# Patient Record
Sex: Female | Born: 1990 | Race: White | Hispanic: No | Marital: Married | Smoking: Former smoker
Health system: Southern US, Community
[De-identification: ages and names within clinical notes are randomized; demographics above are authoritative.]

## PROBLEM LIST (undated history)

## (undated) DIAGNOSIS — A749 Chlamydial infection, unspecified: Secondary | ICD-10-CM

## (undated) DIAGNOSIS — Z87891 Personal history of nicotine dependence: Secondary | ICD-10-CM

## (undated) DIAGNOSIS — J45909 Unspecified asthma, uncomplicated: Secondary | ICD-10-CM

## (undated) DIAGNOSIS — A6000 Herpesviral infection of urogenital system, unspecified: Secondary | ICD-10-CM

## (undated) DIAGNOSIS — F32A Depression, unspecified: Secondary | ICD-10-CM

## (undated) DIAGNOSIS — E282 Polycystic ovarian syndrome: Secondary | ICD-10-CM

## (undated) DIAGNOSIS — F1290 Cannabis use, unspecified, uncomplicated: Secondary | ICD-10-CM

## (undated) DIAGNOSIS — K219 Gastro-esophageal reflux disease without esophagitis: Secondary | ICD-10-CM

## (undated) DIAGNOSIS — Z7289 Other problems related to lifestyle: Secondary | ICD-10-CM

## (undated) DIAGNOSIS — D649 Anemia, unspecified: Secondary | ICD-10-CM

## (undated) DIAGNOSIS — O24419 Gestational diabetes mellitus in pregnancy, unspecified control: Secondary | ICD-10-CM

## (undated) DIAGNOSIS — N739 Female pelvic inflammatory disease, unspecified: Secondary | ICD-10-CM

## (undated) HISTORY — DX: Herpesviral infection of urogenital system, unspecified: A60.00

## (undated) HISTORY — DX: Polycystic ovarian syndrome: E28.2

## (undated) HISTORY — DX: Depression, unspecified: F32.A

## (undated) HISTORY — DX: Chlamydial infection, unspecified: A74.9

## (undated) HISTORY — DX: Female pelvic inflammatory disease, unspecified: N73.9

## (undated) HISTORY — PX: HAND SURGERY: SHX662

---
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
## 2011-08-17 ENCOUNTER — Ambulatory Visit: Payer: Self-pay | Admitting: Family Medicine

## 2011-08-17 LAB — RAPID STREP-A WITH REFLX: Micro Text Report: POSITIVE

## 2011-12-16 ENCOUNTER — Ambulatory Visit: Payer: Self-pay | Admitting: Medical

## 2012-05-09 ENCOUNTER — Ambulatory Visit: Payer: Self-pay | Admitting: Family Medicine

## 2012-05-09 LAB — URINALYSIS, COMPLETE
Glucose,UR: NEGATIVE mg/dL (ref 0–75)
Ketone: NEGATIVE
Ph: 6.5 (ref 4.5–8.0)
Protein: 30

## 2012-05-19 DIAGNOSIS — A749 Chlamydial infection, unspecified: Secondary | ICD-10-CM

## 2012-05-19 HISTORY — DX: Chlamydial infection, unspecified: A74.9

## 2012-05-24 ENCOUNTER — Ambulatory Visit: Payer: Self-pay

## 2012-05-24 LAB — URINALYSIS, COMPLETE
Blood: NEGATIVE
Ph: 6.5 (ref 4.5–8.0)
Protein: NEGATIVE

## 2012-05-24 LAB — WET PREP, GENITAL

## 2012-05-25 LAB — URINE CULTURE

## 2012-09-16 DIAGNOSIS — N739 Female pelvic inflammatory disease, unspecified: Secondary | ICD-10-CM

## 2012-09-16 HISTORY — DX: Female pelvic inflammatory disease, unspecified: N73.9

## 2015-06-26 LAB — HM PAP SMEAR: HM PAP: NEGATIVE

## 2016-02-04 ENCOUNTER — Emergency Department: Payer: Self-pay

## 2016-02-04 ENCOUNTER — Emergency Department
Admission: EM | Admit: 2016-02-04 | Discharge: 2016-02-04 | Disposition: A | Discharge status: Home / Self Care | Payer: Self-pay | Attending: Emergency Medicine | Admitting: Emergency Medicine

## 2016-02-04 ENCOUNTER — Encounter: Payer: Self-pay | Admitting: Emergency Medicine

## 2016-02-04 DIAGNOSIS — R1013 Epigastric pain: Secondary | ICD-10-CM

## 2016-02-04 DIAGNOSIS — R112 Nausea with vomiting, unspecified: Secondary | ICD-10-CM | POA: Insufficient documentation

## 2016-02-04 DIAGNOSIS — K529 Noninfective gastroenteritis and colitis, unspecified: Secondary | ICD-10-CM | POA: Insufficient documentation

## 2016-02-04 HISTORY — DX: Unspecified asthma, uncomplicated: J45.909

## 2016-02-04 LAB — URINALYSIS COMPLETE WITH MICROSCOPIC (ARMC ONLY)
BILIRUBIN URINE: NEGATIVE
Bacteria, UA: NONE SEEN
GLUCOSE, UA: NEGATIVE mg/dL
Hgb urine dipstick: NEGATIVE
Leukocytes, UA: NEGATIVE
Nitrite: NEGATIVE
PH: 8 (ref 5.0–8.0)
Protein, ur: NEGATIVE mg/dL
RBC / HPF: NONE SEEN RBC/hpf (ref 0–5)
Specific Gravity, Urine: 1.015 (ref 1.005–1.030)

## 2016-02-04 LAB — CBC
HCT: 43.5 % (ref 35.0–47.0)
Hemoglobin: 14.8 g/dL (ref 12.0–16.0)
MCH: 30.8 pg (ref 26.0–34.0)
MCHC: 34.1 g/dL (ref 32.0–36.0)
MCV: 90.2 fL (ref 80.0–100.0)
PLATELETS: 235 10*3/uL (ref 150–440)
RBC: 4.82 MIL/uL (ref 3.80–5.20)
RDW: 12.9 % (ref 11.5–14.5)
WBC: 14.9 10*3/uL — ABNORMAL HIGH (ref 3.6–11.0)

## 2016-02-04 LAB — COMPREHENSIVE METABOLIC PANEL
ALK PHOS: 54 U/L (ref 38–126)
ALT: 60 U/L — AB (ref 14–54)
AST: 60 U/L — ABNORMAL HIGH (ref 15–41)
Albumin: 4.4 g/dL (ref 3.5–5.0)
Anion gap: 12 (ref 5–15)
BUN: 9 mg/dL (ref 6–20)
CALCIUM: 9.5 mg/dL (ref 8.9–10.3)
CO2: 20 mmol/L — AB (ref 22–32)
CREATININE: 0.63 mg/dL (ref 0.44–1.00)
Chloride: 108 mmol/L (ref 101–111)
Glucose, Bld: 163 mg/dL — ABNORMAL HIGH (ref 65–99)
Potassium: 3.5 mmol/L (ref 3.5–5.1)
SODIUM: 140 mmol/L (ref 135–145)
Total Bilirubin: 1.2 mg/dL (ref 0.3–1.2)
Total Protein: 7.6 g/dL (ref 6.5–8.1)

## 2016-02-04 LAB — LIPASE, BLOOD: Lipase: 17 U/L (ref 11–51)

## 2016-02-04 LAB — PREGNANCY, URINE: Preg Test, Ur: NEGATIVE

## 2016-02-04 LAB — TROPONIN I: Troponin I: <0.03 ng/mL (ref <0.03)

## 2016-02-04 MED ORDER — PROMETHAZINE HCL 25 MG/ML IJ SOLN
25.0000 mg | Freq: Once | INTRAMUSCULAR | Status: AC
  Administered 2016-02-04: 25 mg via INTRAVENOUS
  Filled 2016-02-04: qty 1

## 2016-02-04 MED ORDER — SODIUM CHLORIDE 0.9 % IV SOLN
1000.0000 mL | Freq: Once | INTRAVENOUS | Status: AC
  Administered 2016-02-04: 1000 mL via INTRAVENOUS

## 2016-02-04 MED ORDER — CIPROFLOXACIN HCL 500 MG PO TABS: 500.0000 mg | ORAL_TABLET | Freq: Two times a day (BID) | ORAL | 0 refills | Status: AC

## 2016-02-04 MED ORDER — FLUCONAZOLE 150 MG PO TABS: 150.0000 mg | ORAL_TABLET | Freq: Every day | ORAL | 0 refills | Status: DC

## 2016-02-04 MED ORDER — ONDANSETRON 4 MG PO TBDP: 4.0000 mg | ORAL_TABLET | Freq: Three times a day (TID) | ORAL | 0 refills | Status: DC | PRN

## 2016-02-04 MED ORDER — ONDANSETRON HCL 4 MG/2ML IJ SOLN
4.0000 mg | Freq: Once | INTRAMUSCULAR | Status: AC
  Administered 2016-02-04: 4 mg via INTRAVENOUS

## 2016-02-04 MED ORDER — IOPAMIDOL (ISOVUE-300) INJECTION 61%
30.0000 mL | Freq: Once | INTRAVENOUS | Status: AC | PRN
  Administered 2016-02-04: 30 mL via ORAL

## 2016-02-04 MED ORDER — OXYCODONE-ACETAMINOPHEN 5-325 MG PO TABS: 1.0000 | ORAL_TABLET | Freq: Four times a day (QID) | ORAL | 0 refills | Status: DC | PRN

## 2016-02-04 MED ORDER — MORPHINE SULFATE (PF) 2 MG/ML IV SOLN
4.0000 mg | Freq: Once | INTRAVENOUS | Status: AC
  Administered 2016-02-04: 4 mg via INTRAVENOUS
  Filled 2016-02-04: qty 2

## 2016-02-04 MED ORDER — METRONIDAZOLE 500 MG PO TABS: 500.0000 mg | ORAL_TABLET | Freq: Three times a day (TID) | ORAL | 0 refills | Status: AC

## 2016-02-04 MED ORDER — ONDANSETRON HCL 4 MG/2ML IJ SOLN
4.0000 mg | Freq: Once | INTRAMUSCULAR | Status: AC
  Administered 2016-02-04: 4 mg via INTRAVENOUS
  Filled 2016-02-04: qty 2

## 2016-02-04 MED ORDER — ONDANSETRON HCL 4 MG/2ML IJ SOLN
INTRAMUSCULAR | Status: AC
Start: 2016-02-04 — End: 2016-02-04
  Administered 2016-02-04: 4 mg via INTRAVENOUS
  Filled 2016-02-04: qty 2

## 2016-02-04 MED ORDER — IOPAMIDOL (ISOVUE-300) INJECTION 61%
100.0000 mL | Freq: Once | INTRAVENOUS | Status: AC | PRN
  Administered 2016-02-04: 100 mL via INTRAVENOUS

## 2016-02-04 NOTE — Discharge Instructions (Signed)
As we discussed a few change her mind and you cannot tolerate fluids/antibiotics at home please return to the emergency department for admission. Please drink plenty of fluids. Return to the emergency department for any worsening abdominal pain, any fever, or any black/bloody stool.

## 2016-02-04 NOTE — ED Triage Notes (Signed)
Says has had cough and sinus issues.  Today started vomiting early this am and hurts in chest and back now.

## 2016-02-04 NOTE — ED Provider Notes (Signed)
[facility] Emergency Department Provider Note   ____________________________________________    I have reviewed the triage vital signs and the nursing notes.   HISTORY  Chief Complaint Cough; Emesis; Facial Pain; and Back Pain     HPI Brittney Roberts is a 25 y.o. female who presents with complaints of upper abdominal pain, nausea and vomiting which began this morning. She reports numerous episodes of vomiting, she describes sharp pain in the upper abdomen, epigastrium preceding her vomiting. No fevers or chills. No diarrhea. No sick contacts.   No past medical history on file.  There are no active problems to display for this patient.   No past surgical history on file.  Prior to Admission medications   Not on File     Allergies Review of patient's allergies indicates no known allergies.  No family history on file.  Social History Social History  Substance Use Topics  . Smoking status: Not on file  . Smokeless tobacco: Not on file  . Alcohol use Not on file    Review of Systems  Constitutional: No fever/chills Eyes: No visual changes.  ENT: No sore throat. Cardiovascular: No chest pain Respiratory: No shortness of breath, no pleurisy Gastrointestinal:As above Genitourinary: Negative for dysuria. Musculoskeletal: Some back pain, lower back Skin: Negative for rash. Neurological: Negative for headaches  10-point ROS otherwise negative.  ____________________________________________   PHYSICAL EXAM:  VITAL SIGNS: ED Triage Vitals [02/04/16 1032]  Enc Vitals Group     BP (!) 153/92     Pulse Rate (!) 122     Resp 20     Temp 97.7 F (36.5 C)     Temp Source Oral     SpO2 100 %     Weight 158 lb (71.7 kg)     Height 5\' 2"  (1.575 m)     Head Circumference      Peak Flow      Pain Score 10     Pain Loc      Pain Edu?      Excl. in GC?     Constitutional: Alert and oriented. Uncomfortable appearing but in no  acute distress Eyes: Conjunctivae are normal.   Nose: No congestion/rhinnorhea. Mouth/Throat: Mucous membranes are moist.    Cardiovascular: Tachycardia, regular rhythm. Grossly normal heart sounds.  Good peripheral circulation. Respiratory: Normal respiratory effort.  No retractions. Lungs CTAB. Gastrointestinal: Tenderness to palpation epigastrium.. No distention.  No CVA tenderness. Genitourinary: deferred Musculoskeletal: No lower extremity tenderness nor edema.  Warm and well perfused Neurologic:  Normal speech and language. No gross focal neurologic deficits are appreciated.  Skin:  Skin is warm, dry and intact. No rash noted. Psychiatric: Mood and affect are normal. Speech and behavior are normal.  ____________________________________________   LABS (all labs ordered are listed, but only abnormal results are displayed)  Labs Reviewed  COMPREHENSIVE METABOLIC PANEL - Abnormal; Notable for the following:       Result Value   CO2 20 (*)    Glucose, Bld 163 (*)    AST 60 (*)    ALT 60 (*)    All other components within normal limits  CBC - Abnormal; Notable for the following:    WBC 14.9 (*)    All other components within normal limits  LIPASE, BLOOD  URINALYSIS COMPLETEWITH MICROSCOPIC ([facility] ONLY)  TROPONIN I  POC URINE PREG, ED   ____________________________________________  EKG  ED ECG REPORT I, Jene Every, the attending physician, personally viewed  and interpreted this ECG.  Date: 02/04/2016 EKG Time: 10:33 AM Rate: 55 Rhythm: Sinus bradycardia QRS Axis: normal Intervals: normal ST/T Wave abnormalities: Inverted T waves V2 and V3 Conduction Disturbances: none   ____________________________________________  RADIOLOGY  CT abdomen and pelvis pending ____________________________________________   PROCEDURES  Procedure(s) performed: No    Critical Care performed: No ____________________________________________   INITIAL IMPRESSION /  ASSESSMENT AND PLAN / ED COURSE  Pertinent labs & imaging results that were available during my care of the patient were reviewed by me and considered in my medical decision making (see chart for details).  Patient presents with pain in the epigastrium, nausea and vomiting. Differential includes gastritis, pancreatitis, PUD, cholecystitis, enteritis or colitis. Morphine and Zofran IV given, normal saline given we will obtain CT abdomen and pelvis.  I have asked my colleague to follow up on CT results and  disposition  Clinical Course   ____________________________________________   FINAL CLINICAL IMPRESSION(S) / ED DIAGNOSES  Final diagnoses:  Epigastric pain      NEW MEDICATIONS STARTED DURING THIS VISIT:  New Prescriptions   No medications on file     Note:  This document was prepared using Dragon voice recognition software and may include unintentional dictation errors.    Jene Everyobert Richie Bonanno, MD 02/04/16 1229

## 2016-02-04 NOTE — ED Provider Notes (Signed)
-----------------------------------------   2:43 PM on 02/04/2016 -----------------------------------------  Patient care assumed from Dr. Cyril LoosenKinner.  Patient is labs are resulted showing a moderate leukocytosis, slight LFT elevation otherwise largely within normal limits. Patient has been vomiting since this morning, does have ketones in the urine. Urine pregnancy test is negative. Troponin is negative. CT scan has resulted showing possible mild right-sided colitis otherwise largely benign. Hemorrhagic cyst but no free fluid. On my examination the patient continues to have moderate epigastric and right-sided abdominal tenderness. No lower abdominal tenderness. No rebound or guarding. Patient continues to state nausea, but is able to tolerate fluids including her oral contrast. I discussed with the patient admission to the hospital for ongoing treatment of her nausea and discomfort. Patient states she would much prefer to go home. We talked about very strict return precautions, patient is agreeable. We will cover the patient for colitis with antibiotics. We'll discharge with pain and nausea medication. Patient is to return to the emergency department for any worsening pain, fever, black or bloody stool.   Minna AntisKevin Donna Snooks, MD 02/04/16 [phone]

## 2016-02-06 ENCOUNTER — Emergency Department
Admission: EM | Admit: 2016-02-06 | Discharge: 2016-02-06 | Disposition: A | Discharge status: Home / Self Care | Payer: Self-pay | Attending: Emergency Medicine | Admitting: Emergency Medicine

## 2016-02-06 DIAGNOSIS — Z79899 Other long term (current) drug therapy: Secondary | ICD-10-CM | POA: Insufficient documentation

## 2016-02-06 DIAGNOSIS — J45909 Unspecified asthma, uncomplicated: Secondary | ICD-10-CM | POA: Insufficient documentation

## 2016-02-06 DIAGNOSIS — K529 Noninfective gastroenteritis and colitis, unspecified: Secondary | ICD-10-CM | POA: Insufficient documentation

## 2016-02-06 LAB — CBC
HEMATOCRIT: 42.3 % (ref 35.0–47.0)
Hemoglobin: 14.6 g/dL (ref 12.0–16.0)
MCH: 30.7 pg (ref 26.0–34.0)
MCHC: 34.6 g/dL (ref 32.0–36.0)
MCV: 88.8 fL (ref 80.0–100.0)
PLATELETS: 262 10*3/uL (ref 150–440)
RBC: 4.76 MIL/uL (ref 3.80–5.20)
RDW: 12.8 % (ref 11.5–14.5)
WBC: 13.5 10*3/uL — ABNORMAL HIGH (ref 3.6–11.0)

## 2016-02-06 LAB — COMPREHENSIVE METABOLIC PANEL
ALBUMIN: 4.7 g/dL (ref 3.5–5.0)
ALT: 47 U/L (ref 14–54)
ANION GAP: 12 (ref 5–15)
AST: 32 U/L (ref 15–41)
Alkaline Phosphatase: 46 U/L (ref 38–126)
BILIRUBIN TOTAL: 1.2 mg/dL (ref 0.3–1.2)
BUN: 17 mg/dL (ref 6–20)
CO2: 23 mmol/L (ref 22–32)
Calcium: 9.4 mg/dL (ref 8.9–10.3)
Chloride: 104 mmol/L (ref 101–111)
Creatinine, Ser: 0.77 mg/dL (ref 0.44–1.00)
GFR calc Af Amer: >60 mL/min (ref >60)
Glucose, Bld: 105 mg/dL — ABNORMAL HIGH (ref 65–99)
POTASSIUM: 3.1 mmol/L — AB (ref 3.5–5.1)
Sodium: 139 mmol/L (ref 135–145)
TOTAL PROTEIN: 8.1 g/dL (ref 6.5–8.1)

## 2016-02-06 MED ORDER — SODIUM CHLORIDE 0.9 % IV BOLUS (SEPSIS)
1000.0000 mL | Freq: Once | INTRAVENOUS | Status: AC
  Administered 2016-02-06: 1000 mL via INTRAVENOUS

## 2016-02-06 MED ORDER — METOCLOPRAMIDE HCL 10 MG PO TABS: 10.0000 mg | ORAL_TABLET | Freq: Three times a day (TID) | ORAL | 1 refills | Status: DC | PRN

## 2016-02-06 MED ORDER — PROMETHAZINE HCL 25 MG/ML IJ SOLN
12.5000 mg | Freq: Once | INTRAMUSCULAR | Status: AC
  Administered 2016-02-06: 12.5 mg via INTRAVENOUS
  Filled 2016-02-06: qty 1

## 2016-02-06 NOTE — ED Triage Notes (Signed)
Pt ambulatory to triage with no difficulty. Pt reports abd pain, n/v/d since 1am Thursday. Seen here on Thursday and given antibiotics and nausea medication. Reports not getting any better.

## 2016-02-06 NOTE — ED Notes (Signed)
Pt. States she was here Thursday with same symptoms, Pt. States she has been unable to take antibiotics due to vomiting and nausea.  Pt. States nausea medication was not helping nausea.

## 2016-02-06 NOTE — ED Provider Notes (Signed)
[facility] Emergency Department Provider Note    First MD Initiated Contact with Patient 02/06/16 0515     (approximate)  I have reviewed the triage vital signs and the nursing notes.   HISTORY  Chief Complaint Abdominal Pain; Emesis; and Diarrhea    HPI Elvia SRAH AKE is a 25 y.o. female presents with generalized abdominal pain nausea vomiting diarrhea with onset 1 AM Thursday. Patient states the diarrhea has resolved however nausea is persistent resulting in decreased by mouth intake over the course of today. Patient states that she feels "dehydrated".  Past Medical History:  Diagnosis Date  . Asthma     There are no active problems to display for this patient.   No past surgical history on file.  Prior to Admission medications   Medication Sig Start Date End Date Taking? Authorizing Provider  ciprofloxacin (CIPRO) 500 MG tablet Take 1 tablet (500 mg total) by mouth 2 (two) times daily. 02/04/16 02/18/16  Minna Antis, MD  fluconazole (DIFLUCAN) 150 MG tablet Take 1 tablet (150 mg total) by mouth daily. Please take one tablet, if needed for vaginal burning/itching. 02/04/16   Minna Antis, MD  metroNIDAZOLE (FLAGYL) 500 MG tablet Take 1 tablet (500 mg total) by mouth 3 (three) times daily. 02/04/16 02/18/16  Minna Antis, MD  ondansetron (ZOFRAN ODT) 4 MG disintegrating tablet Take 1 tablet (4 mg total) by mouth every 8 (eight) hours as needed for nausea or vomiting. 02/04/16   Minna Antis, MD  oxyCODONE-acetaminophen (ROXICET) 5-325 MG tablet Take 1 tablet by mouth every 6 (six) hours as needed. 02/04/16   Minna Antis, MD    Allergies No known drug allergies No family history on file.  Social History Social History  Substance Use Topics  . Smoking status: Not on file  . Smokeless tobacco: Not on file  . Alcohol use Not on file    Review of Systems Constitutional: No fever/chills Eyes: No visual changes. ENT:  No sore throat. Cardiovascular: Denies chest pain. Respiratory: Denies shortness of breath. Gastrointestinal: Positive for nausea  Genitourinary: Negative for dysuria. Musculoskeletal: Negative for back pain. Skin: Negative for rash. Neurological: Negative for headaches, focal weakness or numbness.  10-point ROS otherwise negative.  ____________________________________________   PHYSICAL EXAM:  VITAL SIGNS: ED Triage Vitals  Enc Vitals Group     BP 02/06/16 0502 115/80     Pulse Rate 02/06/16 0502 (!) 50     Resp 02/06/16 0502 16     Temp 02/06/16 0502 98 F (36.7 C)     Temp Source 02/06/16 0502 Oral     SpO2 02/06/16 0502 99 %     Weight 02/06/16 0455 158 lb (71.7 kg)     Height 02/06/16 0455 5\' 2"  (1.575 m)     Head Circumference --      Peak Flow --      Pain Score 02/06/16 0455 9     Pain Loc --      Pain Edu? --      Excl. in GC? --     Constitutional: Alert and oriented. Well appearing and in no acute distress. Eyes: Conjunctivae are normal. PERRL. EOMI. Head: Atraumatic. Ears:  Healthy appearing ear canals and TMs bilaterally Nose: No congestion/rhinnorhea. Mouth/Throat: Mucous membranes are moist.  Oropharynx non-erythematous. Neck: No stridor.  No meningeal signs.   Cardiovascular: Normal rate, regular rhythm. Good peripheral circulation. Grossly normal heart sounds. Respiratory: Normal respiratory effort.  No retractions. Lungs CTAB. Gastrointestinal: Soft and  nontender. No distention.  Musculoskeletal: No lower extremity tenderness nor edema. No gross deformities of extremities. Neurologic:  Normal speech and language. No gross focal neurologic deficits are appreciated.  Skin:  Skin is warm, dry and intact. No rash noted. Psychiatric: Mood and affect are normal. Speech and behavior are normal.  ____________________________________________   LABS (all labs ordered are listed, but only abnormal results are displayed)  Labs Reviewed  CBC - Abnormal;  Notable for the following:       Result Value   WBC 13.5 (*)    All other components within normal limits  COMPREHENSIVE METABOLIC PANEL - Abnormal; Notable for the following:    Potassium 3.1 (*)    Glucose, Bld 105 (*)    All other components within normal limits  GASTROINTESTINAL PANEL BY PCR, STOOL (REPLACES STOOL CULTURE)     Procedures     INITIAL IMPRESSION / ASSESSMENT AND PLAN / ED COURSE  Pertinent labs & imaging results that were available during my care of the patient were reviewed by me and considered in my medical decision making (see chart for details).  CT scan performed on 02/04/2016 revealed possible colitis. Given absence of abdominal pain on exam today no repeat imaging performed. Patient received anti-emetic and IV normal saline with improvement of symptoms.   Clinical Course    ____________________________________________  FINAL CLINICAL IMPRESSION(S) / ED DIAGNOSES  Final diagnoses:  Colitis     MEDICATIONS GIVEN DURING THIS VISIT:  Medications  sodium chloride 0.9 % bolus 1,000 mL (1,000 mLs Intravenous New Bag/Given 02/06/16 0545)  sodium chloride 0.9 % bolus 1,000 mL (1,000 mLs Intravenous New Bag/Given 02/06/16 0545)  promethazine (PHENERGAN) injection 12.5 mg (12.5 mg Intravenous Given 02/06/16 0545)     NEW OUTPATIENT MEDICATIONS STARTED DURING THIS VISIT:  New Prescriptions   No medications on file    Modified Medications   No medications on file    Discontinued Medications   No medications on file     Note:  This document was prepared using Dragon voice recognition software and may include unintentional dictation errors.    Darci Currentandolph N Brown, MD 02/08/16 [phone]

## 2016-05-28 ENCOUNTER — Encounter: Payer: Self-pay | Admitting: Medical Oncology

## 2016-05-28 ENCOUNTER — Emergency Department
Admission: EM | Admit: 2016-05-28 | Discharge: 2016-05-28 | Disposition: A | Discharge status: Home / Self Care | Payer: Self-pay | Attending: Emergency Medicine | Admitting: Emergency Medicine

## 2016-05-28 DIAGNOSIS — J45909 Unspecified asthma, uncomplicated: Secondary | ICD-10-CM | POA: Insufficient documentation

## 2016-05-28 DIAGNOSIS — R1084 Generalized abdominal pain: Secondary | ICD-10-CM | POA: Insufficient documentation

## 2016-05-28 DIAGNOSIS — R112 Nausea with vomiting, unspecified: Secondary | ICD-10-CM | POA: Insufficient documentation

## 2016-05-28 DIAGNOSIS — R509 Fever, unspecified: Secondary | ICD-10-CM | POA: Insufficient documentation

## 2016-05-28 LAB — COMPREHENSIVE METABOLIC PANEL
ALK PHOS: 44 U/L (ref 38–126)
ALT: 26 U/L (ref 14–54)
AST: 32 U/L (ref 15–41)
Albumin: 5.1 g/dL — ABNORMAL HIGH (ref 3.5–5.0)
Anion gap: 11 (ref 5–15)
BILIRUBIN TOTAL: 1.2 mg/dL (ref 0.3–1.2)
BUN: 20 mg/dL (ref 6–20)
CHLORIDE: 101 mmol/L (ref 101–111)
CO2: 25 mmol/L (ref 22–32)
Calcium: 9.5 mg/dL (ref 8.9–10.3)
Creatinine, Ser: 0.85 mg/dL (ref 0.44–1.00)
Glucose, Bld: 122 mg/dL — ABNORMAL HIGH (ref 65–99)
Potassium: 3.6 mmol/L (ref 3.5–5.1)
Sodium: 137 mmol/L (ref 135–145)
Total Protein: 8.5 g/dL — ABNORMAL HIGH (ref 6.5–8.1)

## 2016-05-28 LAB — LIPASE, BLOOD: Lipase: 23 U/L (ref 11–51)

## 2016-05-28 LAB — URINALYSIS, ROUTINE W REFLEX MICROSCOPIC
Bilirubin Urine: NEGATIVE
Glucose, UA: NEGATIVE mg/dL
Hgb urine dipstick: NEGATIVE
Ketones, ur: 20 mg/dL — AB
NITRITE: NEGATIVE
PH: 6 (ref 5.0–8.0)
Protein, ur: 100 mg/dL — AB
SPECIFIC GRAVITY, URINE: 1.029 (ref 1.005–1.030)

## 2016-05-28 LAB — CBC
HEMATOCRIT: 43.6 % (ref 35.0–47.0)
HEMOGLOBIN: 15.4 g/dL (ref 12.0–16.0)
MCH: 30.9 pg (ref 26.0–34.0)
MCHC: 35.3 g/dL (ref 32.0–36.0)
MCV: 87.4 fL (ref 80.0–100.0)
Platelets: 258 10*3/uL (ref 150–440)
RBC: 4.99 MIL/uL (ref 3.80–5.20)
RDW: 12.4 % (ref 11.5–14.5)
WBC: 12.9 10*3/uL — AB (ref 3.6–11.0)

## 2016-05-28 LAB — POCT PREGNANCY, URINE: Preg Test, Ur: NEGATIVE

## 2016-05-28 LAB — INFLUENZA PANEL BY PCR (TYPE A & B)
INFLAPCR: NEGATIVE
Influenza B By PCR: NEGATIVE

## 2016-05-28 MED ORDER — PROMETHAZINE HCL 25 MG/ML IJ SOLN
INTRAMUSCULAR | Status: AC
  Administered 2016-05-28: 25 mg via INTRAVENOUS
  Filled 2016-05-28: qty 1

## 2016-05-28 MED ORDER — ONDANSETRON 4 MG PO TBDP: 4.0000 mg | ORAL_TABLET | Freq: Three times a day (TID) | ORAL | 0 refills | Status: DC | PRN

## 2016-05-28 MED ORDER — PROMETHAZINE HCL 25 MG PO TABS: 25.0000 mg | ORAL_TABLET | Freq: Four times a day (QID) | ORAL | 0 refills | Status: DC | PRN

## 2016-05-28 MED ORDER — ONDANSETRON HCL 4 MG/2ML IJ SOLN
4.0000 mg | Freq: Once | INTRAMUSCULAR | Status: AC
  Administered 2016-05-28: 4 mg via INTRAVENOUS
  Filled 2016-05-28: qty 2

## 2016-05-28 MED ORDER — SODIUM CHLORIDE 0.9 % IV BOLUS (SEPSIS)
1000.0000 mL | Freq: Once | INTRAVENOUS | Status: AC
  Administered 2016-05-28: 1000 mL via INTRAVENOUS

## 2016-05-28 MED ORDER — PROMETHAZINE HCL 25 MG/ML IJ SOLN
25.0000 mg | Freq: Once | INTRAMUSCULAR | Status: AC
  Administered 2016-05-28: 25 mg via INTRAVENOUS

## 2016-05-28 NOTE — ED Notes (Signed)
Cup of ice given.  

## 2016-05-28 NOTE — ED Notes (Signed)

## 2016-05-28 NOTE — ED Triage Notes (Signed)
Pt reports nausea vomiting and fever off and on since Thursday.

## 2016-05-28 NOTE — ED Provider Notes (Signed)
[facility] Emergency Department Provider Note  Time seen: 8:43 AM  I have reviewed the triage vital signs and the nursing notes.   HISTORY  Chief Complaint Fever and Emesis    HPI Brittney Roberts is a 26 y.o. female with a past medical history of asthma who presents to the emergency department for 3 days of nausea, vomiting, cough, congestion and fever. According to the patient for the past 3 days she has had a subjective fever, with occasional cough and congestion. Patient states her main symptom is nausea and vomiting. Denies diarrhea or constipation. Patient states she has not been able to keep down any fluids yesterday or this morning so she came to the emergency department for evaluation. Her last menstrual period was approximately one week ago. Denies any dysuria. States mild diffuse abdominal cramping.  Past Medical History:  Diagnosis Date  . Asthma     There are no active problems to display for this patient.   History reviewed. No pertinent surgical history.  Prior to Admission medications   Medication Sig Start Date End Date Taking? Authorizing Provider  fluconazole (DIFLUCAN) 150 MG tablet Take 1 tablet (150 mg total) by mouth daily. Please take one tablet, if needed for vaginal burning/itching. 02/04/16   Minna AntisKevin Izaan Kingbird, MD  metoCLOPramide (REGLAN) 10 MG tablet Take 1 tablet (10 mg total) by mouth every 8 (eight) hours as needed for nausea. 02/06/16 02/16/16  Darci Currentandolph N Brown, MD  ondansetron (ZOFRAN ODT) 4 MG disintegrating tablet Take 1 tablet (4 mg total) by mouth every 8 (eight) hours as needed for nausea or vomiting. 02/04/16   Minna AntisKevin Addalyn Speedy, MD  oxyCODONE-acetaminophen (ROXICET) 5-325 MG tablet Take 1 tablet by mouth every 6 (six) hours as needed. 02/04/16   Minna AntisKevin Rashida Ladouceur, MD    No Known Allergies  No family history on file.  Social History Social History  Substance Use Topics  . Smoking status: Not on file  . Smokeless  tobacco: Not on file  . Alcohol use Not on file    Review of Systems Constitutional: Negative for fever. Cardiovascular: Negative for chest pain. Respiratory: Negative for shortness of breath. Gastrointestinal: Mild diffuse abdominal cramping. Positive for nausea and vomiting. Negative for diarrhea. Genitourinary: Negative for dysuria. Musculoskeletal: Negative for back pain. Neurological: Negative for headache 10-point ROS otherwise negative.  ____________________________________________   PHYSICAL EXAM:  VITAL SIGNS: ED Triage Vitals  Enc Vitals Group     BP 05/28/16 0834 (!) 161/103     Pulse Rate 05/28/16 0834 (!) 56     Resp 05/28/16 0834 20     Temp 05/28/16 0834 98 F (36.7 C)     Temp Source 05/28/16 0834 Oral     SpO2 05/28/16 0834 99 %     Weight 05/28/16 0829 147 lb (66.7 kg)     Height 05/28/16 0829 5\' 2"  (1.575 m)     Head Circumference --      Peak Flow --      Pain Score 05/28/16 0829 8     Pain Loc --      Pain Edu? --      Excl. in GC? --    Constitutional: Alert and oriented. Well appearing and in no distress. Eyes: Normal exam ENT   Head: Normocephalic and atraumatic.   Mouth/Throat: Dry mucous membranes. Cardiovascular: Normal rate, regular rhythm. No murmur Respiratory: Normal respiratory effort without tachypnea nor retractions. Breath sounds are clear Gastrointestinal: Soft and nontender. Patient does state mild diffuse  cramping but denies any pain with palpation. No distention. Musculoskeletal: Nontender with normal range of motion in all extremities. Neurologic:  Normal speech and language. No gross focal neurologic deficits Skin:  Skin is warm, dry and intact.  Psychiatric: Mood and affect are normal.   ____________________________________________   INITIAL IMPRESSION / ASSESSMENT AND PLAN / ED COURSE  Pertinent labs & imaging results that were available during my care of the patient were reviewed by me and considered in my  medical decision making (see chart for details).  The patient presents the emergency department with subjective fever, cough congestion, nausea and vomiting. These symptoms are very suggestive of viral processes such as influenza. We will check labs, IV hydrate, treat with Zofran and closely monitor in the emergency department.  Patient's labs are largely within normal limits besides a slight leukocytosis of 12,000. Nontender abdomen. Highly suspect viral illness. We will discharge home with Zofran and Phenergan to be used as needed for nausea. I discussed supportive care including significant oral hydration. Patient agreeable.  ____________________________________________   FINAL CLINICAL IMPRESSION(S) / ED DIAGNOSES  Viral illness Nausea and vomiting    Minna Antis, MD 05/28/16 [phone]

## 2016-05-28 NOTE — Discharge Instructions (Signed)
Please take a nausea medications as needed, as prescribed. Please drink plenty of fluids, and obtain plenty of rest. Return to the emergency department for any abdominal pain, fever, or any other symptom personally concerning to yourself. Otherwise please follow-up with your primary care doctor in 2-3 days for recheck/reevaluation.

## 2017-05-08 ENCOUNTER — Encounter: Payer: Self-pay | Admitting: Advanced Practice Midwife

## 2017-05-08 ENCOUNTER — Ambulatory Visit (INDEPENDENT_AMBULATORY_CARE_PROVIDER_SITE_OTHER): Payer: Medicaid Other | Admitting: Advanced Practice Midwife

## 2017-05-08 ENCOUNTER — Telehealth: Payer: Self-pay

## 2017-05-08 VITALS — Wt 144.0 lb

## 2017-05-08 DIAGNOSIS — F1991 Other psychoactive substance use, unspecified, in remission: Secondary | ICD-10-CM

## 2017-05-08 DIAGNOSIS — Z3400 Encounter for supervision of normal first pregnancy, unspecified trimester: Secondary | ICD-10-CM

## 2017-05-08 DIAGNOSIS — Z113 Encounter for screening for infections with a predominantly sexual mode of transmission: Secondary | ICD-10-CM

## 2017-05-08 DIAGNOSIS — Z87898 Personal history of other specified conditions: Secondary | ICD-10-CM

## 2017-05-08 NOTE — Telephone Encounter (Signed)
Pt called stating that she is about 3 months pregnant and having a sharp pain in her lower abdomen that is not going away and not like normal cramping that she has experienced in previous pregnancies.   Left msg for pt that we have not seen her yet during this pregnancy and recommend being seen. Cb# [phone]

## 2017-05-08 NOTE — Patient Instructions (Signed)
Prenatal Care WHAT IS PRENATAL CARE? Prenatal care is the process of caring for a pregnant woman before she gives birth. Prenatal care makes sure that she and her baby remain as healthy as possible throughout pregnancy. Prenatal care may be provided by a midwife, family practice health care provider, or a childbirth and pregnancy specialist (obstetrician). Prenatal care may include physical examinations, testing, treatments, and education on nutrition, lifestyle, and social support services. WHY IS PRENATAL CARE SO IMPORTANT? Early and consistent prenatal care increases the chance that you and your baby will remain healthy throughout your pregnancy. This type of care also decreases a baby's risk of being born too early (prematurely), or being born smaller than expected (small for gestational age). Any underlying medical conditions you may have that could pose a risk during your pregnancy are discussed during prenatal care visits. You will also be monitored regularly for any new conditions that may arise during your pregnancy so they can be treated quickly and effectively. WHAT HAPPENS DURING PRENATAL CARE VISITS? Prenatal care visits may include the following: Discussion Tell your health care provider about any new signs or symptoms you have experienced since your last visit. These might include:  Nausea or vomiting.  Increased or decreased level of energy.  Difficulty sleeping.  Back or leg pain.  Weight changes.  Frequent urination.  Shortness of breath with physical activity.  Changes in your skin, such as the development of a rash or itchiness.  Vaginal discharge or bleeding.  Feelings of excitement or nervousness.  Changes in your baby's movements.  You may want to write down any questions or topics you want to discuss with your health care provider and bring them with you to your appointment. Examination During your first prenatal care visit, you will likely have a complete  physical exam. Your health care provider will often examine your vagina, cervix, and the position of your uterus, as well as check your heart, lungs, and other body systems. As your pregnancy progresses, your health care provider will measure the size of your uterus and your baby's position inside your uterus. He or she may also examine you for early signs of labor. Your prenatal visits may also include checking your blood pressure and, after about 10-12 weeks of pregnancy, listening to your baby's heartbeat. Testing Regular testing often includes:  Urinalysis. This checks your urine for glucose, protein, or signs of infection.  Blood count. This checks the levels of white and red blood cells in your body.  Tests for sexually transmitted infections (STIs). Testing for STIs at the beginning of pregnancy is routinely done and is required in many states.  Antibody testing. You will be checked to see if you are immune to certain illnesses, such as rubella, that can affect a developing fetus.  Glucose screen. Around 24-28 weeks of pregnancy, your blood glucose level will be checked for signs of gestational diabetes. Follow-up tests may be recommended.  Group B strep. This is a bacteria that is commonly found inside a woman's vagina. This test will inform your health care provider if you need an antibiotic to reduce the amount of this bacteria in your body prior to labor and childbirth.  Ultrasound. Many pregnant women undergo an ultrasound screening around 18-20 weeks of pregnancy to evaluate the health of the fetus and check for any developmental abnormalities.  HIV (human immunodeficiency virus) testing. Early in your pregnancy, you will be screened for HIV. If you are at high risk for HIV, this test may   be repeated during your third trimester of pregnancy.  You may be offered other testing based on your age, personal or family medical history, or other factors. HOW OFTEN SHOULD I PLAN TO SEE MY  HEALTH CARE PROVIDER FOR PRENATAL CARE? Your prenatal care check-up schedule depends on any medical conditions you have before, or develop during, your pregnancy. If you do not have any underlying medical conditions, you will likely be seen for checkups:  Monthly, during the first 6 months of pregnancy.  Twice a month during months 7 and 8 of pregnancy.  Weekly starting in the 9th month of pregnancy and until delivery.  If you develop signs of early labor or other concerning signs or symptoms, you may need to see your health care provider more often. Ask your health care provider what prenatal care schedule is best for you. WHAT CAN I DO TO KEEP MYSELF AND MY BABY AS HEALTHY AS POSSIBLE DURING MY PREGNANCY?  Take a prenatal vitamin containing 400 micrograms (0.4 mg) of folic acid every day. Your health care provider may also ask you to take additional vitamins such as iodine, vitamin D, iron, copper, and zinc.  Take 1500-2000 mg of calcium daily starting at your 20th week of pregnancy until you deliver your baby.  Make sure you are up to date on your vaccinations. Unless directed otherwise by your health care provider: ? You should receive a tetanus, diphtheria, and pertussis (Tdap) vaccination between the 27th and 36th week of your pregnancy, regardless of when your last Tdap immunization occurred. This helps protect your baby from whooping cough (pertussis) after he or she is born. ? You should receive an annual inactivated influenza vaccine (IIV) to help protect you and your baby from influenza. This can be done at any point during your pregnancy.  Eat a well-rounded diet that includes: ? Fresh fruits and vegetables. ? Lean proteins. ? Calcium-rich foods such as milk, yogurt, hard cheeses, and dark, leafy greens. ? Whole grain breads.  Do noteat seafood high in mercury, including: ? Swordfish. ? Tilefish. ? Shark. ? King mackerel. ? More than 6 oz tuna per week.  Do not  eat: ? Raw or undercooked meats or eggs. ? Unpasteurized foods, such as soft cheeses (brie, blue, or feta), juices, and milks. ? Lunch meats. ? Hot dogs that have not been heated until they are steaming.  Drink enough water to keep your urine clear or pale yellow. For many women, this may be 10 or more 8 oz glasses of water each day. Keeping yourself hydrated helps deliver nutrients to your baby and may prevent the start of pre-term uterine contractions.  Do not use any tobacco products including cigarettes, chewing tobacco, or electronic cigarettes. If you need help quitting, ask your health care provider.  Do not drink beverages containing alcohol. No safe level of alcohol consumption during pregnancy has been determined.  Do not use any illegal drugs. These can harm your developing baby or cause a miscarriage.  Ask your health care provider or pharmacist before taking any prescription or over-the-counter medicines, herbs, or supplements.  Limit your caffeine intake to no more than 200 mg per day.  Exercise. Unless told otherwise by your health care provider, try to get 30 minutes of moderate exercise most days of the week. Do not  do high-impact activities, contact sports, or activities with a high risk of falling, such as horseback riding or downhill skiing.  Get plenty of rest.  Avoid anything that raises your   body temperature, such as hot tubs and saunas.  If you own a cat, do not empty its litter box. Bacteria contained in cat feces can cause an infection called toxoplasmosis. This can result in serious harm to the fetus.  Stay away from chemicals such as insecticides, lead, mercury, and cleaning or paint products that contain solvents.  Do not have any X-rays taken unless medically necessary.  Take a childbirth and breastfeeding preparation class. Ask your health care provider if you need a referral or recommendation.  This information is not intended to replace advice given  to you by your health care provider. Make sure you discuss any questions you have with your health care provider. Document Released: 04/07/2003 Document Revised: 09/07/2015 Document Reviewed: 06/19/2013 [employer] Interactive Patient Education  2017 [employer] Exercise During Pregnancy For people of all ages, exercise is an important part of being healthy. Exercise improves heart and lung function and helps to maintain strength, flexibility, and a healthy body weight. Exercise also boosts energy levels and elevates mood. For most women, maintaining an exercise routine throughout pregnancy is recommended. It is only on rare occasions and with certain medical conditions or pregnancy complications that women may be asked to limit or avoid exercise during pregnancy. What are some other benefits to exercising during pregnancy? Along with maintaining strength and flexibility, exercising throughout pregnancy can help to:  Keep strength in muscles that are very important during labor and childbirth.  Decrease low back pain during pregnancy.  Decrease the risk of developing gestational diabetes mellitus (GDM).  Improve blood sugar (glucose) control for women who have GDM.  Decrease the risk of developing preeclampsia. This is a serious condition that causes high blood pressure along with other symptoms, such as swelling and headaches.  Decrease the risk of cesarean delivery.  Speed up the recovery after giving birth.  How often should I exercise? Unless your health care provider gives you different instructions, you should try to exercise on most days or all days of the week. In general, try to exercise with moderate intensity for about 150 minutes per week. This can be spread out across several days, such as exercising for 30 minutes per day on 5 days of each week. You can tell that you are exercising at a moderate intensity if you have a higher heart rate and faster breathing, but you are still  able to hold a conversation. What types of moderate-intensity exercise are recommended during pregnancy? There are many types of exercise that are safe for you to do during pregnancy. Unless your health care provider gives you different instructions, do a variety of exercises that safely increase your heart and breathing (cardiopulmonary) rates and help you to build and maintain muscle strength (strength training). You should always be able to talk in full sentences while exercising during pregnancy. Some examples of exercising that is safe to do during pregnancy include:  Brisk walking or hiking.  Swimming.  Water aerobics.  Riding a stationary bike.  Strength training.  Modified yoga or Pilates. Tell your instructor that you are pregnant. Avoid overstretching and avoid lying on your back for long periods of time.  Running or jogging. Only choose this type of exercise if: ? You ran or jogged regularly before your pregnancy. ? You can run or jog and still talk in complete sentences.  What types of exercise should I not do during pregnancy? Depending on your level of fitness and whether you exercised regularly before your pregnancy, you may be   advised to limit vigorous-intensity exercise during your pregnancy. You can tell that you are exercising at a vigorous intensity if you are breathing much harder and faster and cannot hold a conversation while exercising. Some examples of exercising that you should avoid during pregnancy include:  Contact sports.  Activities that place you at risk for falling on or being hit in the belly, such as downhill skiing, water skiing, surfing, rock climbing, cycling, gymnastics, and horseback riding.  Scuba diving.  Sky diving.  Yoga or Pilates in a room that is heated to extreme temperatures ("hot yoga" or "hot Pilates").  Jogging or running, unless you ran or jogged regularly before your pregnancy. While jogging or running, you should always be able  to talk in full sentences. Do not run or jog so vigorously that you are unable to have a conversation.  If you are not used to exercising at elevation (more than 6,000 feet above sea level), do not do so during your pregnancy.  When should I avoid exercising during pregnancy? Certain medical conditions can make it unsafe to exercise during pregnancy, or they may increase your risk of miscarriage or early labor and birth. Some of these conditions include:  Some types of heart disease.  Some types of lung disease.  Placenta previa. This is when the placenta partially or completely covers the opening of the uterus (cervix).  Frequent bleeding from the vagina during your pregnancy.  Incompetent cervix. This is when your cervix does not remain as tightly closed during pregnancy as it should.  Premature labor.  Ruptured membranes. This is when the protective sac (amniotic sac) opens up and amniotic fluid leaks from your vagina.  Severely low blood count (anemia).  Preeclampsia or pregnancy-caused high blood pressure.  Carrying more than one baby (multiple gestation) and having an additional risk of early labor.  Poorly controlled diabetes.  Being severely underweight or severely overweight.  Intrauterine growth restriction. This is when your baby's growth and development during pregnancy are slower than expected.  Other medical conditions. Ask your health care provider if any apply to you.  What else should I know about exercising during pregnancy? You should take these precautions while exercising during pregnancy:  Avoid overheating. ? Wear loose-fitting, breathable clothes. ? Do not exercise in very high temperatures.  Avoid dehydration. Drink enough water before, during, and after exercise to keep your urine clear or pale yellow.  Avoid overstretching. Because of hormone changes during pregnancy, it is easy to overstretch muscles, tendons, and ligaments during  pregnancy.  Start slowly and ask your health care provider to recommend types of exercise that are safe for you, if exercising regularly is new for you.  Pregnancy is not a time for exercising to lose weight. When should I seek medical care? You should stop exercising and call your health care provider if you have any unusual symptoms, such as:  Mild uterine contractions or abdominal cramping.  Dizziness that does not improve with rest.  When should I seek immediate medical care? You should stop exercising and call your local emergency services (911 in the [place] if you have any unusual symptoms, such as:  Sudden, severe pain in your low back or your belly.  Uterine contractions or abdominal cramping that do not improve with rest.  Chest pain.  Bleeding or fluid leaking from your vagina.  Shortness of breath.  This information is not intended to replace advice given to you by your health care provider. Make sure you discuss any   questions you have with your health care provider. Document Released: 04/04/2005 Document Revised: 09/02/2015 Document Reviewed: 06/12/2014 [employer] Interactive Patient Education  2018 [employer] Eating Plan for Pregnant Women While you are pregnant, your body will require additional nutrition to help support your growing baby. It is recommended that you consume:  150 additional calories each day during your first trimester.  300 additional calories each day during your second trimester.  300 additional calories each day during your third trimester.  Eating a healthy, well-balanced diet is very important for your health and for your baby's health. You also have a higher need for some vitamins and minerals, such as folic acid, calcium, iron, and vitamin D. What do I need to know about eating during pregnancy?  Do not try to lose weight or go on a diet during pregnancy.  Choose healthy, nutritious foods. Choose  of a sandwich with a glass of milk  instead of a candy bar or a high-calorie sugar-sweetened beverage.  Limit your overall intake of foods that have "empty calories." These are foods that have little nutritional value, such as sweets, desserts, candies, sugar-sweetened beverages, and fried foods.  Eat a variety of foods, especially fruits and vegetables.  Take a prenatal vitamin to help meet the additional needs during pregnancy, specifically for folic acid, iron, calcium, and vitamin D.  Remember to stay active. Ask your health care provider for exercise recommendations that are specific to you.  Practice good food safety and cleanliness, such as washing your hands before you eat and after you prepare raw meat. This helps to prevent foodborne illnesses, such as listeriosis, that can be very dangerous for your baby. Ask your health care provider for more information about listeriosis. What does 150 extra calories look like? Healthy options for an additional 150 calories each day could be any of the following:  Plain low-fat yogurt (6-8 oz) with  cup of berries.  1 apple with 2 teaspoons of peanut butter.  Cut-up vegetables with  cup of hummus.  Low-fat chocolate milk (8 oz or 1 cup).  1 string cheese with 1 medium orange.   of a peanut butter and jelly sandwich on whole-wheat bread (1 tsp of peanut butter).  For 300 calories, you could eat two of those healthy options each day. What is a healthy amount of weight to gain? The recommended amount of weight for you to gain is based on your pre-pregnancy BMI. If your pre-pregnancy BMI was:  Less than 18 (underweight), you should gain 28-40 lb.  18-24.9 (normal), you should gain 25-35 lb.  25-29.9 (overweight), you should gain 15-25 lb.  Greater than 30 (obese), you should gain 11-20 lb.  What if I am having twins or multiples? Generally, pregnant women who will be having twins or multiples may need to increase their daily calories by 300-600 calories each day. The  recommended range for total weight gain is 25-54 lb, depending on your pre-pregnancy BMI. Talk with your health care provider for specific guidance about additional nutritional needs, weight gain, and exercise during your pregnancy. What foods can I eat? Grains Any grains. Try to choose whole grains, such as whole-wheat bread, oatmeal, or brown rice. Vegetables Any vegetables. Try to eat a variety of colors and types of vegetables to get a full range of vitamins and minerals. Remember to wash your vegetables well before eating. Fruits Any fruits. Try to eat a variety of colors and types of fruit to get a full range of vitamins and   minerals. Remember to wash your fruits well before eating. Meats and Other Protein Sources Lean meats, including chicken, turkey, fish, and lean cuts of beef, veal, or pork. Make sure that all meats are cooked to "well done." Tofu. Tempeh. Beans. Eggs. Peanut butter and other nut butters. Seafood, such as shrimp, crab, and lobster. If you choose fish, select types that are higher in omega-3 fatty acids, including salmon, herring, mussels, trout, sardines, and pollock. Make sure that all meats are cooked to food-safe temperatures. Dairy Pasteurized milk and milk alternatives. Pasteurized yogurt and pasteurized cheese. Cottage cheese. Sour cream. Beverages Water. Juices that contain 100% fruit juice or vegetable juice. Caffeine-free teas and decaffeinated coffee. Drinks that contain caffeine are okay to drink, but it is better to avoid caffeine. Keep your total caffeine intake to less than 200 mg each day (12 oz of coffee, tea, or soda) or as directed by your health care provider. Condiments Any pasteurized condiments. Sweets and Desserts Any sweets and desserts. Fats and Oils Any fats and oils. The items listed above may not be a complete list of recommended foods or beverages. Contact your dietitian for more options. What foods are not  recommended? Vegetables Unpasteurized (raw) vegetable juices. Fruits Unpasteurized (raw) fruit juices. Meats and Other Protein Sources Cured meats that have nitrates, such as bacon, salami, and hotdogs. Luncheon meats, bologna, or other deli meats (unless they are reheated until they are steaming hot). Refrigerated pate, meat spreads from a meat counter, smoked seafood that is found in the refrigerated section of a store. Raw fish, such as sushi or sashimi. High mercury content fish, such as tilefish, shark, swordfish, and king mackerel. Raw meats, such as tuna or beef tartare. Undercooked meats and poultry. Make sure that all meats are cooked to food-safe temperatures. Dairy Unpasteurized (raw) milk and any foods that have raw milk in them. Soft cheeses, such as feta, queso blanco, queso fresco, Brie, Camembert cheeses, blue-veined cheeses, and Panela cheese (unless it is made with pasteurized milk, which must be stated on the label). Beverages Alcohol. Sugar-sweetened beverages, such as sodas, teas, or energy drinks. Condiments Homemade fermented foods and drinks, such as pickles, sauerkraut, or kombucha drinks. (Store-bought pasteurized versions of these are okay.) Other Salads that are made in the store, such as ham salad, chicken salad, egg salad, tuna salad, and seafood salad. The items listed above may not be a complete list of foods and beverages to avoid. Contact your dietitian for more information. This information is not intended to replace advice given to you by your health care provider. Make sure you discuss any questions you have with your health care provider. Document Released: 01/17/2014 Document Revised: 09/10/2015 Document Reviewed: 09/17/2013 [employer] Interactive Patient Education  2018 [employer]  

## 2017-05-08 NOTE — Progress Notes (Signed)
New Obstetric Patient H&P    Chief Complaint: "Desires prenatal care"   History of Present Illness: Patient is a 27 y.o. G1P0000 Not Hispanic or Latino female, LMP 02/08/2017 presents with amenorrhea and positive home pregnancy test. Based on her LMP, her EDD is Estimated Date of Delivery: 11/15/17 and her EGA is [redacted]w[redacted]d. Cycles are 5. days, regular, and occur approximately every : 28 days. Her last pap smear was 1 or 2 years ago and was no abnormalities.    She had a urine pregnancy test which was positive 7 week(s)  ago. Her last menstrual period was normal and lasted for  5 day(s). Since her LMP she claims she has experienced breast tenderness, fatigue, nausea, vomiting. She denies vaginal bleeding. Her past medical history is noncontributory. This is her first pregnancy.  Since her LMP, she admits to the use of tobacco products  Yes she is trying to quit and currently smoking 5-6 cig/day She claims she has gained   8 pounds since the start of her pregnancy.  There are cats in the home in the home  yes If yes Indoor She admits close contact with children on a regular basis  no  She has had chicken pox in the past vaccinated She has had Tuberculosis exposures, symptoms, or previously tested positive for TB   no Current or past history of domestic violence. no  Genetic Screening/Teratology Counseling: (Includes patient, baby's father, or anyone in either family with:)   1. Patient's age >/= 80 at [facility]  no 2. Thalassemia ([place], [place], Mediterranean, or Asian background): MCV<80  no 3. Neural tube defect (meningomyelocele, spina bifida, anencephaly)  no 4. Congenital heart defect  no  5. Down syndrome  no 6. Tay-Sachs (Jewish, [place]  no 7. Canavan's Disease  no 8. Sickle cell disease or trait (African)  no  9. Hemophilia or other blood disorders  no  10. Muscular dystrophy  no  11. Cystic fibrosis  no  12. Huntington's Chorea  no  13. Mental retardation/autism  no 14.  Other inherited genetic or chromosomal disorder  no 15. Maternal metabolic disorder (DM, PKU, etc)  no 16. Patient or FOB with a child with a birth defect not listed above no  16a. Patient or FOB with a birth defect themselves no 17. Recurrent pregnancy loss, or stillbirth  no  18. Any medications since LMP other than prenatal vitamins (include vitamins, supplements, OTC meds, drugs, alcohol)  no 19. Any other genetic/environmental exposure to discuss  Occasional marijuana use  Infection History:   1. Lives with someone with TB or TB exposed  no  2. Patient or partner has history of genital herpes  yes 3. Rash or viral illness since LMP  no 4. History of STI (GC, CT, HPV, syphilis, HIV)  no 5. History of recent travel :  no  Other pertinent information:  no    Review of Systems:10 point review of systems negative unless otherwise noted in HPI  Past Medical History:  Past Medical History:  Diagnosis Date  . Asthma   . Chlamydia 05/2012   [facility]  . Herpes genitalia   . Pelvic inflammatory disease 09/2012   [facility]  . Polycystic ovaries     Past Surgical History:  Past Surgical History:  Procedure Laterality Date  . HAND SURGERY Left     Gynecologic History: Patient's last menstrual period was 02/08/2017 (exact date).  Obstetric History: G1P0000  Family History:  Family History  Problem Relation  Age of Onset  . Migraines Mother   . Alzheimer's disease Maternal Grandmother   . Arthritis Maternal Grandmother   . Diabetes Maternal Grandmother   . Heart disease Maternal Grandmother   . Hyperlipidemia Maternal Grandmother   . Hypertension Maternal Grandmother   . Arthritis Maternal Grandfather   . Bipolar disorder Maternal Grandfather   . Heart disease Maternal Grandfather        MYOCARD. INFARCT-OLD/HEALED  . Hyperlipidemia Maternal Grandfather   . Hypertension Maternal Grandfather   . Cancer Paternal Aunt        BREAST    Social History:  Social  History   Socioeconomic History  . Marital status: Married    Spouse name: Not on file  . Number of children: 0  . Years of education: 5014  . Highest education level: Not on file  Social Needs  . Financial resource strain: Not on file  . Food insecurity - worry: Not on file  . Food insecurity - inability: Not on file  . Transportation needs - medical: Not on file  . Transportation needs - non-medical: Not on file  Occupational History  . Occupation: waitress    Comment: [employer]  Tobacco Use  . Smoking status: Current Every Day Smoker    Types: Cigarettes  . Smokeless tobacco: Never Used  . Tobacco comment: 1/3 PPD  Substance and Sexual Activity  . Alcohol use: Yes    Comment: 1-4/WK; last drink 02/18/17  . Drug use: Yes    Types: Marijuana    Comment: occ  . Sexual activity: Yes    Birth control/protection: None  Other Topics Concern  . Not on file  Social History Narrative  . Not on file    Allergies:  No Known Allergies  Medications: Prior to Admission medications   Not on File    Physical Exam Vitals: Weight 144 lb (65.3 kg), last menstrual period 02/08/2017.  General: NAD HEENT: normocephalic, anicteric Thyroid: no enlargement, no palpable nodules Pulmonary: No increased work of breathing, CTAB Cardiovascular: RRR, distal pulses 2+ Abdomen: NABS, soft, non-tender, non-distended.  Umbilicus without lesions.  No hepatomegaly, splenomegaly or masses palpable. No evidence of hernia  Genitourinary:  External: Normal external female genitalia.  Normal urethral meatus, normal  Bartholin's and Skene's glands.    Vagina: Normal vaginal mucosa, no evidence of prolapse.    Cervix: Grossly normal in appearance, no bleeding  Uterus: Enlarged, mobile, normal contour.  No CMT  Adnexa: ovaries non-enlarged, no adnexal masses  Rectal: deferred Extremities: no edema, erythema, or tenderness Neurologic: Grossly intact Psychiatric: mood appropriate, affect  full   Assessment: 27 y.o. G1P0000 at 2945w5d presenting to initiate prenatal care  Plan: 1) Avoid alcoholic beverages. 2) Patient encouraged not to smoke.  3) Discontinue the use of all non-medicinal drugs and chemicals.  4) Take prenatal vitamins daily.  5) Nutrition, food safety (fish, cheese advisories, and high nitrite foods) and exercise discussed. 6) Hospital and practice style discussed with cross coverage system.  7) Genetic Screening, such as with 1st Trimester Screening, cell free fetal DNA, AFP testing, and Ultrasound, as well as with amniocentesis and CVS as appropriate, is discussed with patient. At the conclusion of today's visit patient requested genetic testing 8) Patient is asked about travel to areas at risk for the Zika virus, and counseled to avoid travel and exposure to mosquitoes or sexual partners who may have themselves been exposed to the virus. Testing is discussed, and will be ordered as appropriate.  9)  Dating scan and 1st trimester screen in 1 week  Tresea Mall, CNM

## 2017-05-08 NOTE — Telephone Encounter (Signed)
Correction, this is patients first pregnancy. She was scheduled for NOB on 1/24 with JEG, able to move NOB to this afternoon with JEG. Pt aware and appreciative.

## 2017-05-08 NOTE — Progress Notes (Signed)
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 rj

## 2017-05-10 LAB — CHLAMYDIA/GONOCOCCUS/TRICHOMONAS, NAA
Chlamydia by NAA: NEGATIVE
Gonococcus by NAA: NEGATIVE
TRICH VAG BY NAA: NEGATIVE

## 2017-05-11 ENCOUNTER — Encounter: Payer: Self-pay | Admitting: Advanced Practice Midwife

## 2017-05-11 LAB — URINE CULTURE

## 2017-05-13 LAB — URINE DRUG PANEL 7
Amphetamines, Urine: NEGATIVE ng/mL
BENZODIAZEPINE QUANT UR: NEGATIVE ng/mL
Barbiturate Quant, Ur: NEGATIVE ng/mL
Cannabinoid Quant, Ur: POSITIVE — AB
Cocaine (Metab.): NEGATIVE ng/mL
Opiate Quant, Ur: NEGATIVE
PCP Quant, Ur: NEGATIVE ng/mL

## 2017-05-18 ENCOUNTER — Encounter: Payer: Medicaid Other | Admitting: Obstetrics and Gynecology

## 2017-05-18 ENCOUNTER — Other Ambulatory Visit: Payer: Medicaid Other

## 2017-05-23 ENCOUNTER — Ambulatory Visit (INDEPENDENT_AMBULATORY_CARE_PROVIDER_SITE_OTHER): Payer: Medicaid Other | Admitting: Obstetrics and Gynecology

## 2017-05-23 ENCOUNTER — Ambulatory Visit (INDEPENDENT_AMBULATORY_CARE_PROVIDER_SITE_OTHER): Payer: Medicaid Other

## 2017-05-23 ENCOUNTER — Encounter: Payer: Self-pay | Admitting: Obstetrics and Gynecology

## 2017-05-23 ENCOUNTER — Other Ambulatory Visit: Payer: Self-pay | Admitting: Advanced Practice Midwife

## 2017-05-23 VITALS — BP 108/64 | Wt 142.0 lb

## 2017-05-23 DIAGNOSIS — Z3689 Encounter for other specified antenatal screening: Secondary | ICD-10-CM

## 2017-05-23 DIAGNOSIS — Z3400 Encounter for supervision of normal first pregnancy, unspecified trimester: Secondary | ICD-10-CM

## 2017-05-23 DIAGNOSIS — Z113 Encounter for screening for infections with a predominantly sexual mode of transmission: Secondary | ICD-10-CM

## 2017-05-23 DIAGNOSIS — Z8619 Personal history of other infectious and parasitic diseases: Secondary | ICD-10-CM

## 2017-05-23 DIAGNOSIS — Z3A14 14 weeks gestation of pregnancy: Secondary | ICD-10-CM

## 2017-05-23 NOTE — Progress Notes (Signed)
    Routine Prenatal Care Visit  Subjective  Brittney Roberts is a 27 y.o. G1P0000 at 1631w6d being seen today for ongoing prenatal care.  She is currently monitored for the following issues for this low-risk pregnancy and has Supervision of normal first pregnancy, antepartum and Hx of herpes genitalis on their problem list.  ----------------------------------------------------------------------------------- Patient reports no complaints.   Contractions: Not present. Vag. Bleeding: None.  Denies leaking of fluid.  ----------------------------------------------------------------------------------- The following portions of the patient's history were reviewed and updated as appropriate: allergies, current medications, past family history, past medical history, past social history, past surgical history and problem list. Problem list updated.   Objective  Blood pressure 108/64, weight 142 lb (64.4 kg), last menstrual period 02/08/2017. Pregravid weight 136 lb (61.7 kg) Total Weight Gain 6 lb (2.722 kg) Urinalysis: Urine Protein: Negative Urine Glucose: Negative  Fetal Status: Fetal Heart Rate (bpm): 154         General:  Alert, oriented and cooperative. Patient is in no acute distress.  Skin: Skin is warm and dry. No rash noted.   Cardiovascular: Normal heart rate noted  Respiratory: Normal respiratory effort, no problems with respiration noted  Abdomen: Soft, gravid, appropriate for gestational age. Pain/Pressure: Absent     Pelvic:  Cervical exam deferred        Extremities: Normal range of motion.     ental Status: Normal mood and affect. Normal behavior. Normal judgment and thought content.     Assessment   27 y.o. G1P0000 at 3631w6d by  11/15/2017, by Last Menstrual Period presenting for routine prenatal visit  Plan   FIRST Problems (from 05/08/17 to present)    Problem Noted Resolved   Hx of herpes genitalis 05/23/2017 by Natale MilchSchuman, Christanna R, MD No   Overview Signed 05/23/2017   4:52 PM by Natale MilchSchuman, Christanna R, MD    [ ]  initiate valtrex at 36 weeks          Preterm labor symptoms and general obstetric precautions including but not limited to vaginal bleeding, contractions, leaking of fluid and fetal movement were reviewed in detail with the patient. Please refer to After Visit Summary for other counseling recommendations.   NOB labs drawn today.  Return in about 4 weeks (around 06/20/2017) for US and ROB.  Adelene Idlerhristanna Schuman MD 05/23/17 11:41 PM

## 2017-05-24 ENCOUNTER — Other Ambulatory Visit: Payer: Self-pay | Admitting: Obstetrics & Gynecology

## 2017-05-24 LAB — RPR+RH+ABO+RUB AB+AB SCR+CB...
Antibody Screen: NEGATIVE
HEP B S AG: NEGATIVE
HIV Screen 4th Generation wRfx: NONREACTIVE
Hematocrit: 35.7 % (ref 34.0–46.6)
Hemoglobin: 12.1 g/dL (ref 11.1–15.9)
MCH: 30.6 pg (ref 26.6–33.0)
MCHC: 33.9 g/dL (ref 31.5–35.7)
MCV: 90 fL (ref 79–97)
PLATELETS: 214 10*3/uL (ref 150–379)
RBC: 3.95 x10E6/uL (ref 3.77–5.28)
RDW: 12.6 % (ref 12.3–15.4)
RPR Ser Ql: NONREACTIVE
Rh Factor: POSITIVE
Rubella Antibodies, IGG: 4.76 index (ref >0.99)
VARICELLA: 1474 {index} (ref >165)
WBC: 7.8 10*3/uL (ref 3.4–10.8)

## 2017-05-24 NOTE — Progress Notes (Signed)
ULTRASOUND REPORT  Location: [facility] Date of Service: 05/23/2017   Indications:dating, 2nd trimester Findings:  Singleton intrauterine pregnancy is visualized with FHR at 154 BPM. Biometrics give an (U/S) Gestational age of 2857w4d and an (U/S) EDD of 11/10/2017; this correlates with the clinically established Estimated Date of Delivery: 11/15/17.  Fetal presentation is Variable.  Placenta: fundal, grade 0. AFI: subjectively normal cm  Impression: 1. 4687w6d Viable Singleton Intrauterine pregnancy previously established criteria.  Recommendations: 1.Clinical correlation with the patient's History and Physical Exam. 2. Follow up in 4-5 weeks for anatomic survey.  Willette AlmaKristen Priestley, RDMS, RVT  Review of ULTRASOUND.    [facility] have personally reviewed images and [facility]    Plan of management to be discussed with patient.  Annamarie MajorPaul Harris, MD, Merlinda FrederickFACOG [facility], [facility] 05/24/2017  11:22 AM

## 2017-06-22 ENCOUNTER — Encounter: Payer: Self-pay | Admitting: Obstetrics and Gynecology

## 2017-06-22 ENCOUNTER — Ambulatory Visit (INDEPENDENT_AMBULATORY_CARE_PROVIDER_SITE_OTHER): Payer: Medicaid Other

## 2017-06-22 ENCOUNTER — Ambulatory Visit (INDEPENDENT_AMBULATORY_CARE_PROVIDER_SITE_OTHER): Payer: Medicaid Other | Admitting: Obstetrics and Gynecology

## 2017-06-22 VITALS — BP 112/78 | Wt 147.0 lb

## 2017-06-22 DIAGNOSIS — Z3A19 19 weeks gestation of pregnancy: Secondary | ICD-10-CM

## 2017-06-22 DIAGNOSIS — Z1379 Encounter for other screening for genetic and chromosomal anomalies: Secondary | ICD-10-CM

## 2017-06-22 DIAGNOSIS — Z8619 Personal history of other infectious and parasitic diseases: Secondary | ICD-10-CM

## 2017-06-22 DIAGNOSIS — Z3400 Encounter for supervision of normal first pregnancy, unspecified trimester: Secondary | ICD-10-CM

## 2017-06-22 DIAGNOSIS — F1210 Cannabis abuse, uncomplicated: Secondary | ICD-10-CM

## 2017-06-22 DIAGNOSIS — F1290 Cannabis use, unspecified, uncomplicated: Secondary | ICD-10-CM | POA: Insufficient documentation

## 2017-06-22 DIAGNOSIS — R5383 Other fatigue: Secondary | ICD-10-CM

## 2017-06-22 NOTE — Progress Notes (Signed)
Routine Prenatal Care Visit  Subjective  Brittney Roberts is a 27 y.o. G1P0000 at 3667w1d being seen today for ongoing prenatal care.  She is currently monitored for the following issues for this low-risk pregnancy and has Supervision of normal first pregnancy, antepartum; Hx of herpes genitalis; and Marijuana abuse on their problem list.  ----------------------------------------------------------------------------------- Patient reports no complaints.  She would like a new prenatal vitamin, she has been feeling fatigues.  Contractions: Not present. Vag. Bleeding: None.  Movement: Present. Denies leaking of fluid.  ----------------------------------------------------------------------------------- The following portions of the patient's history were reviewed and updated as appropriate: allergies, current medications, past family history, past medical history, past social history, past surgical history and problem list. Problem list updated.   Objective  Blood pressure 112/78, weight 147 lb (66.7 kg), last menstrual period 02/08/2017. Pregravid weight 136 lb (61.7 kg) Total Weight Gain 11 lb (4.99 kg) Urinalysis: Urine Protein: Negative Urine Glucose: Negative  Fetal Status: Fetal Heart Rate (bpm): 142   Movement: Present     General:  Alert, oriented and cooperative. Patient is in no acute distress.  Skin: Skin is warm and dry. No rash noted.   Cardiovascular: Normal heart rate noted  Respiratory: Normal respiratory effort, no problems with respiration noted  Abdomen: Soft, gravid, appropriate for gestational age. Pain/Pressure: Absent     Pelvic:  Cervical exam deferred        Extremities: Normal range of motion.     ental Status: Normal mood and affect. Normal behavior. Normal judgment and thought content.     Assessment   27 y.o. G1P0000 at 5667w1d by  11/15/2017, by Last Menstrual Period presenting for work-in prenatal visit  Plan   FIRST Problems (from 05/08/17 to present)    Problem Noted Resolved   Hx of herpes genitalis 05/23/2017 by Natale MilchSchuman, Lorelai Huyser R, MD No   Overview Signed 05/23/2017  4:52 PM by Natale MilchSchuman, Danyia Borunda R, MD    [ ]  initiate valtrex at 36 weeks      Supervision of normal first pregnancy, antepartum 05/08/2017 by Tresea MallGledhill, Jane, CNM No   Overview Addendum 06/22/2017  4:15 PM by Natale MilchSchuman, Maeci Kalbfleisch R, MD      Clinic [facility] Prenatal Labs  Dating  Blood type: O/Positive/-- (02/05 1542)   Genetic Screen 1 Screen:     AFP:      Quad:      NIPS:    Antibody:Negative (02/05 1542)  Anatomic US  Rubella: 4.76 (02/05 1542) Varicella: @VZVIGG @  GTT Early:        28 wk:      RPR: Non Reactive (02/05 1542)   Rhogam  HBsAg: Negative (02/05 1542)   TDaP vaccine                       HIV: Non Reactive (02/05 1542)   Flu Shot   Declines                             GBS:   Contraception  Pap:  CBB     CS/VBAC    Baby Food    Support Person   husband Simonne ComeLeo               Gestational age appropriate obstetric precautions including but not limited to vaginal bleeding, contractions, leaking of fluid and fetal movement were reviewed in detail with the patient.    Given book on breast feeding Given  schedule of prenatal classes Discussed birth control options, desires condoms Given information on cord blood banking NIPS collected today Given samples of various prenatal vitamins, will try them and let us know what she likes. Fatigue will check CBC today. Return in about 4 weeks (around 07/20/2017) for [name] and ROB.  Adelene Idler MD [facility], [facility] 06/22/2017, 4:50 PM

## 2017-06-29 LAB — MATERNIT 21 PLUS CORE, BLOOD
CHROMOSOME 13: NEGATIVE
CHROMOSOME 18: NEGATIVE
CHROMOSOME 21: NEGATIVE
Y CHROMOSOME: DETECTED

## 2017-07-13 ENCOUNTER — Telehealth: Payer: Self-pay

## 2017-07-13 NOTE — Telephone Encounter (Signed)
Pt called triage stating she felt that baby was on her left side under her ribs and it was very uncomfortable she also stated she was feeling sick to her stomach. Pt called to see if it was normal to feel that way. Called pt to get more information but no answer so left VM for her to call back.

## 2017-07-27 ENCOUNTER — Ambulatory Visit (INDEPENDENT_AMBULATORY_CARE_PROVIDER_SITE_OTHER): Payer: Medicaid Other

## 2017-07-27 ENCOUNTER — Ambulatory Visit (INDEPENDENT_AMBULATORY_CARE_PROVIDER_SITE_OTHER): Payer: Medicaid Other | Admitting: Obstetrics and Gynecology

## 2017-07-27 VITALS — BP 112/78 | Wt 149.0 lb

## 2017-07-27 DIAGNOSIS — F1210 Cannabis abuse, uncomplicated: Secondary | ICD-10-CM

## 2017-07-27 DIAGNOSIS — Z3400 Encounter for supervision of normal first pregnancy, unspecified trimester: Secondary | ICD-10-CM

## 2017-07-27 DIAGNOSIS — Z3A24 24 weeks gestation of pregnancy: Secondary | ICD-10-CM

## 2017-07-27 DIAGNOSIS — Z8619 Personal history of other infectious and parasitic diseases: Secondary | ICD-10-CM

## 2017-07-27 NOTE — Progress Notes (Signed)
    Routine Prenatal Care Visit  Subjective  Brittney Roberts is a 27 y.o. G1P0000 at 2865w1d being seen today for ongoing prenatal care.  She is currently monitored for the following issues for this low-risk pregnancy and has Supervision of normal first pregnancy, antepartum; Hx of herpes genitalis; and Marijuana abuse on their problem list.  ----------------------------------------------------------------------------------- Patient reports no complaints.    .  .   . Denies leaking of fluid.  ----------------------------------------------------------------------------------- The following portions of the patient's history were reviewed and updated as appropriate: allergies, current medications, past family history, past medical history, past social history, past surgical history and problem list. Problem list updated.   Objective  Last menstrual period 02/08/2017. Pregravid weight 136 lb (61.7 kg) Total Weight Gain 11 lb (4.99 kg) Urinalysis:      Fetal Status:           General:  Alert, oriented and cooperative. Patient is in no acute distress.  Skin: Skin is warm and dry. No rash noted.   Cardiovascular: Normal heart rate noted  Respiratory: Normal respiratory effort, no problems with respiration noted  Abdomen: Soft, gravid, appropriate for gestational age.       Pelvic:  Cervical exam deferred        Extremities: Normal range of motion.     ental Status: Normal mood and affect. Normal behavior. Normal judgment and thought content.     Assessment   27 y.o. G1P0000 at 5365w1d by  11/15/2017, by Last Menstrual Period presenting for routine prenatal visit  Plan   FIRST Problems (from 05/08/17 to present)    Problem Noted Resolved   Hx of herpes genitalis 05/23/2017 by Natale MilchSchuman, Christanna R, MD No   Overview Signed 05/23/2017  4:52 PM by Natale MilchSchuman, Christanna R, MD    [ ]  initiate valtrex at 36 weeks      Supervision of normal first pregnancy, antepartum 05/08/2017 by Tresea MallGledhill, Jane,  CNM No   Overview Addendum 07/27/2017  1:32 PM by Natale MilchSchuman, Christanna R, MD      Clinic [facility] Prenatal Labs  Dating  Blood type: O/Positive/-- (02/05 1542)   Genetic Screen NIPS:  Normal XY Antibody:Negative (02/05 1542)  Anatomic US  incomplete, XY Rubella: 4.76 (02/05 1542) Varicella: Immune  GTT Early:        28 wk:      RPR: Non Reactive (02/05 1542)   Rhogam  Not applicable HBsAg: Negative (02/05 1542)   TDaP vaccine                       HIV: Non Reactive (02/05 1542)   Flu Shot   Declines                             GBS:   Contraception  Condoms Pap:  CBB   Given information   CS/VBAC  Not applicable   Baby Food  Breast   Support Person   husband Simonne ComeLeo               Gestational age appropriate obstetric precautions including but not limited to vaginal bleeding, contractions, leaking of fluid and fetal movement were reviewed in detail with the patient.    No follow-ups on file.  Adelene Idlerhristanna Schuman MD [facility], [facility] 07/27/2017, 1:38 PM

## 2017-08-24 ENCOUNTER — Encounter: Payer: Medicaid Other | Admitting: Obstetrics and Gynecology

## 2017-08-25 ENCOUNTER — Ambulatory Visit (INDEPENDENT_AMBULATORY_CARE_PROVIDER_SITE_OTHER): Payer: Medicaid Other | Admitting: Obstetrics and Gynecology

## 2017-08-25 ENCOUNTER — Encounter: Payer: Self-pay | Admitting: Obstetrics and Gynecology

## 2017-08-25 VITALS — BP 121/78 | Wt 162.0 lb

## 2017-08-25 DIAGNOSIS — Z3400 Encounter for supervision of normal first pregnancy, unspecified trimester: Secondary | ICD-10-CM

## 2017-08-25 DIAGNOSIS — Z3A28 28 weeks gestation of pregnancy: Secondary | ICD-10-CM

## 2017-08-25 DIAGNOSIS — O1203 Gestational edema, third trimester: Secondary | ICD-10-CM

## 2017-08-25 NOTE — Progress Notes (Signed)
Routine Prenatal Care Visit  Subjective  Brittney Roberts is a 27 y.o. G1P0000 at [redacted]w[redacted]d being seen today for ongoing prenatal care.  She is currently monitored for the following issues for this low-risk pregnancy and has Supervision of normal first pregnancy, antepartum; Hx of herpes genitalis; Marijuana abuse; and Swelling of lower extremity during pregnancy in third trimester on their problem list.  ----------------------------------------------------------------------------------- Patient reports bilateral leg swelling.   Contractions: Not present. Vag. Bleeding: None.  Movement: Present. Denies leaking of fluid.  ----------------------------------------------------------------------------------- The following portions of the patient's history were reviewed and updated as appropriate: allergies, current medications, past family history, past medical history, past social history, past surgical history and problem list. Problem list updated.   Objective  Blood pressure 121/78, weight 162 lb (73.5 kg), last menstrual period 02/08/2017. Pregravid weight 136 lb (61.7 kg) Total Weight Gain 26 lb (11.8 kg) Urinalysis: Urine Protein: 1+ Urine Glucose: Negative  Fetal Status: Fetal Heart Rate (bpm): 140   Movement: Present     General:  Alert, oriented and cooperative. Patient is in no acute distress.  Skin: Skin is warm and dry. No rash noted.   Cardiovascular: Normal heart rate noted  Respiratory: Normal respiratory effort, no problems with respiration noted  Abdomen: Soft, gravid, appropriate for gestational age. Pain/Pressure: Present     Pelvic:  Cervical exam deferred        Extremities: Normal range of motion.     ental Status: Normal mood and affect. Normal behavior. Normal judgment and thought content.     Assessment   27 y.o. G1P0000 at [redacted]w[redacted]d by  11/15/2017, by Last Menstrual Period presenting for routine prenatal visit  Plan   FIRST Problems (from 05/08/17 to present)    Problem Noted Resolved   Swelling of lower extremity during pregnancy in third trimester 08/25/2017 by Natale Milch, MD No   Hx of herpes genitalis 05/23/2017 by Natale Milch, MD No   Overview Signed 05/23/2017  4:52 PM by Natale Milch, MD     initiate valtrex at 36 weeks      Supervision of normal first pregnancy, antepartum 05/08/2017 by Tresea Mall, CNM No   Overview Addendum 07/27/2017  1:32 PM by Natale Milch, MD      Clinic [facility] Prenatal Labs  Dating  Blood type: O/Positive/-- (02/05 1542)   Genetic Screen NIPS:  Normal XY Antibody:Negative (02/05 1542)  Anatomic [name]  incomplete, XY Rubella: 4.76 (02/05 1542) Varicella: Immune  GTT Early:        28 wk:      RPR: Non Reactive (02/05 1542)   Rhogam  Not applicable HBsAg: Negative (02/05 1542)   TDaP vaccine                       HIV: Non Reactive (02/05 1542)   Flu Shot   Declines                             GBS:   Contraception  Condoms Pap:  CBB   Given information   CS/VBAC  Not applicable   Baby Food  Breast   Support Person   husband Simonne Come               Gestational age appropriate obstetric precautions including but not limited to vaginal bleeding, contractions, leaking of fluid and fetal movement were reviewed in detail with the patient.  Patient can not stay today for 1hr GTT and labs. Will return on Monday. Return in about 3 days (around 08/28/2017) for 1hr gtt and ROB.  She has had a 13 lb weight gain in the last 4 weeks, increased swelling of her lower extremities with 1+ pitting edema to her knees. She denies headaches, right upper quadrant pain or vision changes. Discussed that these were warning signs of preeclampsia and to go to the hospital if she experiences these symptoms. Blood pressure today is normal. Recheck at her next visit.  Will also check CMP and protein creatine ratio.  Discussed elevating legs at night, compression stockings and ace wrapping her legs at night to  help with returning fluid to circulation.    Adelene Idler MD [facility], [facility] 08/25/17 11:49 AM

## 2017-08-30 ENCOUNTER — Other Ambulatory Visit: Payer: Medicaid Other

## 2017-08-30 ENCOUNTER — Other Ambulatory Visit: Payer: Self-pay

## 2017-08-30 ENCOUNTER — Ambulatory Visit (INDEPENDENT_AMBULATORY_CARE_PROVIDER_SITE_OTHER): Payer: Medicaid Other | Admitting: Advanced Practice Midwife

## 2017-08-30 ENCOUNTER — Encounter: Payer: Self-pay | Admitting: Advanced Practice Midwife

## 2017-08-30 VITALS — BP 120/80 | Wt 160.0 lb

## 2017-08-30 DIAGNOSIS — Z3400 Encounter for supervision of normal first pregnancy, unspecified trimester: Secondary | ICD-10-CM

## 2017-08-30 DIAGNOSIS — Z3A29 29 weeks gestation of pregnancy: Secondary | ICD-10-CM

## 2017-08-30 NOTE — Patient Instructions (Signed)
Third Trimester of Pregnancy The third trimester is from week 28 through week 40 (months 7 through 9). The third trimester is a time when the unborn baby (fetus) is growing rapidly. At the end of the ninth month, the fetus is about 20 inches in length and weighs 6-10 pounds. Body changes during your third trimester Your body will continue to go through many changes during pregnancy. The changes vary from woman to woman. During the third trimester:  Your weight will continue to increase. You can expect to gain 25-35 pounds (11-16 kg) by the end of the pregnancy.  You may begin to get stretch marks on your hips, abdomen, and breasts.  You may urinate more often because the fetus is moving lower into your pelvis and pressing on your bladder.  You may develop or continue to have heartburn. This is caused by increased hormones that slow down muscles in the digestive tract.  You may develop or continue to have constipation because increased hormones slow digestion and cause the muscles that push waste through your intestines to relax.  You may develop hemorrhoids. These are swollen veins (varicose veins) in the rectum that can itch or be painful.  You may develop swollen, bulging veins (varicose veins) in your legs.  You may have increased body aches in the pelvis, back, or thighs. This is due to weight gain and increased hormones that are relaxing your joints.  You may have changes in your hair. These can include thickening of your hair, rapid growth, and changes in texture. Some women also have hair loss during or after pregnancy, or hair that feels dry or thin. Your hair will most likely return to normal after your baby is born.  Your breasts will continue to grow and they will continue to become tender. A yellow fluid (colostrum) may leak from your breasts. This is the first milk you are producing for your baby.  Your belly button may stick out.  You may notice more swelling in your hands,  face, or ankles.  You may have increased tingling or numbness in your hands, arms, and legs. The skin on your belly may also feel numb.  You may feel short of breath because of your expanding uterus.  You may have more problems sleeping. This can be caused by the size of your belly, increased need to urinate, and an increase in your body's metabolism.  You may notice the fetus "dropping," or moving lower in your abdomen (lightening).  You may have increased vaginal discharge.  You may notice your joints feel loose and you may have pain around your pelvic bone.  What to expect at prenatal visits You will have prenatal exams every 2 weeks until week 36. Then you will have weekly prenatal exams. During a routine prenatal visit:  You will be weighed to make sure you and the baby are growing normally.  Your blood pressure will be taken.  Your abdomen will be measured to track your baby's growth.  The fetal heartbeat will be listened to.  Any test results from the previous visit will be discussed.  You may have a cervical check near your due date to see if your cervix has softened or thinned (effaced).  You will be tested for Group B streptococcus. This happens between 35 and 37 weeks.  Your health care provider may ask you:  What your birth plan is.  How you are feeling.  If you are feeling the baby move.  If you have had   any abnormal symptoms, such as leaking fluid, bleeding, severe headaches, or abdominal cramping.  If you are using any tobacco products, including cigarettes, chewing tobacco, and electronic cigarettes.  If you have any questions.  Other tests or screenings that may be performed during your third trimester include:  Blood tests that check for low iron levels (anemia).  Fetal testing to check the health, activity level, and growth of the fetus. Testing is done if you have certain medical conditions or if there are problems during the  pregnancy.  Nonstress test (NST). This test checks the health of your baby to make sure there are no signs of problems, such as the baby not getting enough oxygen. During this test, a belt is placed around your belly. The baby is made to move, and its heart rate is monitored during movement.  What is false labor? False labor is a condition in which you feel small, irregular tightenings of the muscles in the womb (contractions) that usually go away with rest, changing position, or drinking water. These are called Braxton Hicks contractions. Contractions may last for hours, days, or even weeks before true labor sets in. If contractions come at regular intervals, become more frequent, increase in intensity, or become painful, you should see your health care provider. What are the signs of labor?  Abdominal cramps.  Regular contractions that start at 10 minutes apart and become stronger and more frequent with time.  Contractions that start on the top of the uterus and spread down to the lower abdomen and back.  Increased pelvic pressure and dull back pain.  A watery or bloody mucus discharge that comes from the vagina.  Leaking of amniotic fluid. This is also known as your "water breaking." It could be a slow trickle or a gush. Let your health care provider know if it has a color or strange odor. If you have any of these signs, call your health care provider right away, even if it is before your due date. Follow these instructions at home: Medicines  Follow your health care provider's instructions regarding medicine use. Specific medicines may be either safe or unsafe to take during pregnancy.  Take a prenatal vitamin that contains at least 600 micrograms (mcg) of folic acid.  If you develop constipation, try taking a stool softener if your health care provider approves. Eating and drinking  Eat a balanced diet that includes fresh fruits and vegetables, whole grains, good sources of protein  such as meat, eggs, or tofu, and low-fat dairy. Your health care provider will help you determine the amount of weight gain that is right for you.  Avoid raw meat and uncooked cheese. These carry germs that can cause birth defects in the baby.  If you have low calcium intake from food, talk to your health care provider about whether you should take a daily calcium supplement.  Eat four or five small meals rather than three large meals a day.  Limit foods that are high in fat and processed sugars, such as fried and sweet foods.  To prevent constipation: ? Drink enough fluid to keep your urine clear or pale yellow. ? Eat foods that are high in fiber, such as fresh fruits and vegetables, whole grains, and beans. Activity  Exercise only as directed by your health care provider. Most women can continue their usual exercise routine during pregnancy. Try to exercise for 30 minutes at least 5 days a week. Stop exercising if you experience uterine contractions.  Avoid heavy   lifting.  Do not exercise in extreme heat or humidity, or at high altitudes.  Wear low-heel, comfortable shoes.  Practice good posture.  You may continue to have sex unless your health care provider tells you otherwise. Relieving pain and discomfort  Take frequent breaks and rest with your legs elevated if you have leg cramps or low back pain.  Take warm sitz baths to soothe any pain or discomfort caused by hemorrhoids. Use hemorrhoid cream if your health care provider approves.  Wear a good support bra to prevent discomfort from breast tenderness.  If you develop varicose veins: ? Wear support pantyhose or compression stockings as told by your healthcare provider. ? Elevate your feet for 15 minutes, 3-4 times a day. Prenatal care  Write down your questions. Take them to your prenatal visits.  Keep all your prenatal visits as told by your health care provider. This is important. Safety  Wear your seat belt at  all times when driving.  Make a list of emergency phone numbers, including numbers for family, friends, the hospital, and police and fire departments. General instructions  Avoid cat litter boxes and soil used by cats. These carry germs that can cause birth defects in the baby. If you have a cat, ask someone to clean the litter box for you.  Do not travel far distances unless it is absolutely necessary and only with the approval of your health care provider.  Do not use hot tubs, steam rooms, or saunas.  Do not drink alcohol.  Do not use any products that contain nicotine or tobacco, such as cigarettes and e-cigarettes. If you need help quitting, ask your health care provider.  Do not use any medicinal herbs or unprescribed drugs. These chemicals affect the formation and growth of the baby.  Do not douche or use tampons or scented sanitary pads.  Do not cross your legs for long periods of time.  To prepare for the arrival of your baby: ? Take prenatal classes to understand, practice, and ask questions about labor and delivery. ? Make a trial run to the hospital. ? Visit the hospital and tour the maternity area. ? Arrange for maternity or paternity leave through employers. ? Arrange for family and friends to take care of pets while you are in the hospital. ? Purchase a rear-facing car seat and make sure you know how to install it in your car. ? Pack your hospital bag. ? Prepare the baby's nursery. Make sure to remove all pillows and stuffed animals from the baby's crib to prevent suffocation.  Visit your dentist if you have not gone during your pregnancy. Use a soft toothbrush to brush your teeth and be gentle when you floss. Contact a health care provider if:  You are unsure if you are in labor or if your water has broken.  You become dizzy.  You have mild pelvic cramps, pelvic pressure, or nagging pain in your abdominal area.  You have lower back pain.  You have persistent  nausea, vomiting, or diarrhea.  You have an unusual or bad smelling vaginal discharge.  You have pain when you urinate. Get help right away if:  Your water breaks before 37 weeks.  You have regular contractions less than 5 minutes apart before 37 weeks.  You have a fever.  You are leaking fluid from your vagina.  You have spotting or bleeding from your vagina.  You have severe abdominal pain or cramping.  You have rapid weight loss or weight gain.    You have shortness of breath with chest pain.  You notice sudden or extreme swelling of your face, hands, ankles, feet, or legs.  Your baby makes fewer than 10 movements in 2 hours.  You have severe headaches that do not go away when you take medicine.  You have vision changes. Summary  The third trimester is from week 28 through week 40, months 7 through 9. The third trimester is a time when the unborn baby (fetus) is growing rapidly.  During the third trimester, your discomfort may increase as you and your baby continue to gain weight. You may have abdominal, leg, and back pain, sleeping problems, and an increased need to urinate.  During the third trimester your breasts will keep growing and they will continue to become tender. A yellow fluid (colostrum) may leak from your breasts. This is the first milk you are producing for your baby.  False labor is a condition in which you feel small, irregular tightenings of the muscles in the womb (contractions) that eventually go away. These are called Braxton Hicks contractions. Contractions may last for hours, days, or even weeks before true labor sets in.  Signs of labor can include: abdominal cramps; regular contractions that start at 10 minutes apart and become stronger and more frequent with time; watery or bloody mucus discharge that comes from the vagina; increased pelvic pressure and dull back pain; and leaking of amniotic fluid. This information is not intended to replace advice  given to you by your health care provider. Make sure you discuss any questions you have with your health care provider. Document Released: 03/29/2001 Document Revised: 09/10/2015 Document Reviewed: 06/05/2012 Elsevier Interactive Patient Education  2017 [employer] Inc.  

## 2017-08-30 NOTE — Progress Notes (Signed)
Routine Prenatal Care Visit  Subjective  Brittney Roberts is a 27 y.o. G1P0000 at [redacted]w[redacted]d being seen today for ongoing prenatal care.  She is currently monitored for the following issues for this low-risk pregnancy and has Supervision of normal first pregnancy, antepartum; Hx of herpes genitalis; Marijuana abuse; and Swelling of lower extremity during pregnancy in third trimester on their problem list.  ----------------------------------------------------------------------------------- Patient reports some mild lower extremity swelling. She is wearing support hose with good relief.   Contractions: Not present. Vag. Bleeding: None.  Movement: Present. Denies leaking of fluid.  ----------------------------------------------------------------------------------- The following portions of the patient's history were reviewed and updated as appropriate: allergies, current medications, past family history, past medical history, past social history, past surgical history and problem list. Problem list updated.   Objective  Blood pressure 120/80, weight 160 lb (72.6 kg), last menstrual period 02/08/2017. Pregravid weight 136 lb (61.7 kg) Total Weight Gain 24 lb (10.9 kg) Urinalysis:      Fetal Status: Fetal Heart Rate (bpm): 157 Fundal Height: 29 cm Movement: Present     General:  Alert, oriented and cooperative. Patient is in no acute distress.  Skin: Skin is warm and dry. No rash noted.   Cardiovascular: Normal heart rate noted  Respiratory: Normal respiratory effort, no problems with respiration noted  Abdomen: Soft, gravid, appropriate for gestational age. Pain/Pressure: Absent     Pelvic:  Cervical exam deferred        Extremities: Normal range of motion.  Edema: Trace  Mental Status: Normal mood and affect. Normal behavior. Normal judgment and thought content.   Assessment   27 y.o. G1P0000 at [redacted]w[redacted]d by  11/15/2017, by Last Menstrual Period presenting for routine prenatal visit  Plan    FIRST Problems (from 05/08/17 to present)    Problem Noted Resolved   Swelling of lower extremity during pregnancy in third trimester 08/25/2017 by Natale Milch, MD No   Hx of herpes genitalis 05/23/2017 by Natale Milch, MD No   Overview Signed 05/23/2017  4:52 PM by Natale Milch, MD     initiate valtrex at 36 weeks      Supervision of normal first pregnancy, antepartum 05/08/2017 by Tresea Mall, CNM No   Overview Addendum 07/27/2017  1:32 PM by Natale Milch, MD      [facility]inic [facility] Prenatal Labs  Dating  Blood type: O/Positive/-- (02/05 1542)   Genetic Screen NIPS:  Normal XY Antibody:Negative (02/05 1542)  Anatomic [name]  incomplete, XY Rubella: 4.76 (02/05 1542) Varicella: Immune  GTT Early:        28 wk:      RPR: Non Reactive (02/05 1542)   Rhogam  Not applicable HBsAg: Negative (02/05 1542)   TDaP vaccine                       HIV: Non Reactive (02/05 1542)   Flu Shot   Declines                             GBS:   Contraception  Condoms Pap:  CBB   Given information   CS/VBAC  Not applicable   Baby Food  Breast   Support Person   husband Simonne Come               Preterm labor symptoms and general obstetric precautions including but not limited to vaginal bleeding, contractions, leaking of fluid and fetal  movement were reviewed in detail with the patient. Please refer to After Visit Summary for other counseling recommendations.   Return in about 2 weeks (around 09/13/2017) for rob.  Tresea Mall, CNM 08/30/2017 3:34 PM

## 2017-08-31 LAB — COMPREHENSIVE METABOLIC PANEL
ALK PHOS: 74 IU/L (ref 39–117)
ALT: 16 IU/L (ref 0–32)
AST: 28 IU/L (ref 0–40)
Albumin/Globulin Ratio: 1.1 — ABNORMAL LOW (ref 1.2–2.2)
Albumin: 3.1 g/dL — ABNORMAL LOW (ref 3.5–5.5)
BUN/Creatinine Ratio: 13 (ref 9–23)
BUN: 6 mg/dL (ref 6–20)
Bilirubin Total: 0.2 mg/dL (ref 0.0–1.2)
CO2: 20 mmol/L (ref 20–29)
CREATININE: 0.48 mg/dL — AB (ref 0.57–1.00)
Calcium: 8.6 mg/dL — ABNORMAL LOW (ref 8.7–10.2)
Chloride: 106 mmol/L (ref 96–106)
GFR calc Af Amer: 157 mL/min/{1.73_m2} (ref >59)
GFR calc non Af Amer: 136 mL/min/{1.73_m2} (ref >59)
Globulin, Total: 2.7 g/dL (ref 1.5–4.5)
Glucose: 114 mg/dL — ABNORMAL HIGH (ref 65–99)
Potassium: 3.8 mmol/L (ref 3.5–5.2)
Sodium: 140 mmol/L (ref 134–144)
Total Protein: 5.8 g/dL — ABNORMAL LOW (ref 6.0–8.5)

## 2017-08-31 LAB — 28 WEEK RH+PANEL
BASOS ABS: 0 10*3/uL (ref 0.0–0.2)
Basos: 0 %
EOS (ABSOLUTE): 0.2 10*3/uL (ref 0.0–0.4)
Eos: 2 %
GESTATIONAL DIABETES SCREEN: 112 mg/dL (ref 65–139)
HEMATOCRIT: 31.7 % — AB (ref 34.0–46.6)
HIV Screen 4th Generation wRfx: NONREACTIVE
Hemoglobin: 11 g/dL — ABNORMAL LOW (ref 11.1–15.9)
Immature Grans (Abs): 0 10*3/uL (ref 0.0–0.1)
Immature Granulocytes: 0 %
LYMPHS ABS: 1.6 10*3/uL (ref 0.7–3.1)
Lymphs: 16 %
MCH: 30.7 pg (ref 26.6–33.0)
MCHC: 34.7 g/dL (ref 31.5–35.7)
MCV: 89 fL (ref 79–97)
MONOCYTES: 5 %
Monocytes Absolute: 0.5 10*3/uL (ref 0.1–0.9)
NEUTROS PCT: 77 %
Neutrophils Absolute: 8 10*3/uL — ABNORMAL HIGH (ref 1.4–7.0)
PLATELETS: 193 10*3/uL (ref 150–379)
RBC: 3.58 x10E6/uL — ABNORMAL LOW (ref 3.77–5.28)
RDW: 13 % (ref 12.3–15.4)
RPR Ser Ql: NONREACTIVE
WBC: 10.4 10*3/uL (ref 3.4–10.8)

## 2017-08-31 LAB — PROTEIN / CREATININE RATIO, URINE
CREATININE, UR: 107.1 mg/dL
PROTEIN UR: 17.4 mg/dL
PROTEIN/CREAT RATIO: 162 mg/g{creat} (ref 0–200)

## 2017-08-31 LAB — SPECIMEN STATUS REPORT

## 2017-08-31 NOTE — Progress Notes (Signed)
Negative for gestational diabetes. Released to mychart.

## 2017-09-04 ENCOUNTER — Telehealth: Payer: Self-pay

## 2017-09-04 NOTE — Telephone Encounter (Signed)
Agree with that advice.

## 2017-09-04 NOTE — Telephone Encounter (Signed)
Pt states she fell Saturday afternoon, did not fall on stomach, fell on her back. Baby is moving fine, no vaginal bleeding or ctx. Everything is fine but her mom wanted her to call.   Advised patient it sounds like she does not need to be seen. Advised if she starts noticing vaginal bleeding, major cramping or decrease in fetal movement she would need to be seen. Advised her to keep her scheduled appointment. Pt voiced an understanding and thanked me for giving her a call. I told her I would send this to a provider just as an [place].

## 2017-09-13 ENCOUNTER — Ambulatory Visit (INDEPENDENT_AMBULATORY_CARE_PROVIDER_SITE_OTHER): Payer: Medicaid Other | Admitting: Obstetrics and Gynecology

## 2017-09-13 ENCOUNTER — Encounter: Payer: Self-pay | Admitting: Obstetrics and Gynecology

## 2017-09-13 VITALS — BP 110/70 | Wt 157.0 lb

## 2017-09-13 DIAGNOSIS — Z3400 Encounter for supervision of normal first pregnancy, unspecified trimester: Secondary | ICD-10-CM

## 2017-09-13 DIAGNOSIS — Z8619 Personal history of other infectious and parasitic diseases: Secondary | ICD-10-CM

## 2017-09-13 DIAGNOSIS — Z3A31 31 weeks gestation of pregnancy: Secondary | ICD-10-CM

## 2017-09-13 DIAGNOSIS — F1210 Cannabis abuse, uncomplicated: Secondary | ICD-10-CM

## 2017-09-13 DIAGNOSIS — E46 Unspecified protein-calorie malnutrition: Secondary | ICD-10-CM

## 2017-09-13 NOTE — Patient Instructions (Signed)
Healthy Eating to Prevent Digestive Disorders  The digestive system starts at the mouth and goes all the way down to the rectum. Along the way, your digestive system breaks down the food you eat so you can absorb its nutrients and use them for energy.  Digestive disorders can cause gas, bloating, pain, heartburn, and other symptoms. They can prevent your digestive system from doing its job. Healthy eating and a healthy lifestyle can help you avoid many common digestive disorders.  What nutrition changes can be made?  Start by eating a balanced diet. Eat healthy foods from all the major food groups. These include carbohydrates, fats, and proteins. Other changes you can make include to:   Eat enough fiber. Fiber is a healthy carbohydrate that cleans out your digestive system. Fiber absorbs water and helps you have regular bowel movements. Fiber comes from plants. To get enough fiber in your diet, eat 4-5 servings of fruits, vegetables, and legumes every day. Include beans and whole grains. Most people should get 20?35 grams of fiber each day.   Drink enough water to keep your urine clear or pale yellow. Water helps your body digest food. It can also help prevent constipation.   Avoid fatty proteins. Full-fat dairy products and fatty meats are hard to digest. Fats you want to avoid are those that get solid at room temperature (saturated fats). Instead of eating these kinds of fats, eat plant-based unsaturated fats found in olives, canola, corn, avocado, and nuts.   If you have trouble with gas, belching, or flatulence, avoid gas-producing foods. These include beans, carbonated beverages, cabbage, cauliflower, and broccoli. If you are lactose intolerant, avoid dairy products or choose lactose-free dairy products.   If you have frequent heartburn, stay away from alcohol, caffeine, fatty foods, chocolate, and peppermint. Avoid lying down within two hours of eating a full meal. Overeating and lying down too soon after  a meal can cause heartburn.   Add probiotics to your diet. Healthy digestion depends on having the right balance of good bacteria in your colon. Probiotics can help restore the balance of good bacteria in your digestive system. Probiotics are live active cultures that are found in yogurt, kefir, and cultured foods like sauerkraut and miso. You can also add good bacteria with probiotic supplements.   Make sure to chew your food slowly and completely.   Instead of eating three large meals each day, eat three small meals with three small snacks.    What other changes can I make?  You can help your digestive system stay healthy by making these lifestyle changes:   Stay active and exercise every day.   Maintain a healthy weight.   Eat on a regular schedule.   Avoid tight-fitting clothes. They can restrict digestion.   If you have frequent heartburn, raise the head of your bed 2-3 inches (5-7.5 cm).   Do not use any tobacco products, such as cigarettes, chewing tobacco, and e-cigarettes. If you need help quitting, ask your health care provider.   Limit alcohol intake to no more than 1 drink a day for nonpregnant women and 2 drinks a day for men. One drink equals 12 oz of beer, 5 oz of wine, or 1 oz of hard liquor.   Avoid stress. Find ways to reduce stress, such as meditation, exercise, or taking time for activities that relax you.    Why should I make these changes?  Making these changes will help your digestive system function at its best. A   healthy digestive system can help you avoid or improve your management of digestive disorders such as:   Bloating, gas, and flatulence.   Heartburn.   Gastroesophageal reflux disease (GERD).   Peptic ulcer disease.   Hemorrhoids.   Diverticulitis.   Constipation.   Diarrhea.   Gall stones   Irritable bowel syndrome.   Malnutrition.   Fatty liver disease.    What can happen if changes are not made?  Not making these changes could put you at risk for many  conditions caused by a poor diet or an unhealthy weight, such as heart disease, stroke and diabetes.  Where can I get more information?  Learn more about healthy eating and digestive disorders by visiting these websites:   Academy of Nutrition and Dietetics: http://www.eatright.org/resources/health/wellness/digestive-health   Centers for Disease Control and Prevention: https://health.gov/dietaryguidelines/2015/   [place].S. Department of Health and Human Services: https://www.womenshealth.gov/files/assets/docs/the-healthy-woman/digestive_health.pdf    Summary   A heathy diet can help prevent many digestive disorders.   Eat a balanced diet consisting of fiber, unsaturated fats, lean protein, fruits, and vegetables.   Eat three small meals with three small snacks per day.   Drink plenty of water every day.   Get plenty of exercise and maintain a healthy weight.  This information is not intended to replace advice given to you by your health care provider. Make sure you discuss any questions you have with your health care provider.  Document Released: 05/01/2015 Document Revised: 09/10/2015 Document Reviewed: 12/16/2015  Elsevier Interactive Patient Education  2018 [employer] Inc.

## 2017-09-13 NOTE — Progress Notes (Signed)
Routine Prenatal Care Visit  Subjective  Brittney Roberts is a 27 y.o. G1P0000 at [redacted]w[redacted]d being seen today for ongoing prenatal care.  She is currently monitored for the following issues for this high-risk pregnancy and has Supervision of normal first pregnancy, antepartum; Hx of herpes genitalis; Marijuana abuse; and Swelling of lower extremity during pregnancy in third trimester on their problem list.  ----------------------------------------------------------------------------------- Patient reports continued swelling of lowe extremities. Occasional nausea and vomiting..   Contractions: Not present. Vag. Bleeding: None.  Movement: Present. Denies leaking of fluid.  ----------------------------------------------------------------------------------- The following portions of the patient's history were reviewed and updated as appropriate: allergies, current medications, past family history, past medical history, past social history, past surgical history and problem list. Problem list updated.   Objective  Blood pressure 110/70, weight 157 lb (71.2 kg), last menstrual period 02/08/2017. Pregravid weight 136 lb (61.7 kg) Total Weight Gain 21 lb (9.526 kg) Urinalysis: Urine Protein: 1+ Urine Glucose: Negative  Fetal Status: Fetal Heart Rate (bpm): 140 Fundal Height: 31 cm Movement: Present     General:  Alert, oriented and cooperative. Patient is in no acute distress.  Skin: Skin is warm and dry. No rash noted.   Cardiovascular: Normal heart rate noted  Respiratory: Normal respiratory effort, no problems with respiration noted  Abdomen: Soft, gravid, appropriate for gestational age. Pain/Pressure: Present     Pelvic:  Cervical exam deferred        Extremities: Normal range of motion.  Edema: Deep pitting, indentation remains for a short time  ental Status: Normal mood and affect. Normal behavior. Normal judgment and thought content.     Assessment   27 y.o. G1P0000 at [redacted]w[redacted]d by   11/15/2017, by Last Menstrual Period presenting for routine prenatal visit  Plan   FIRST Problems (from 05/08/17 to present)    Problem Noted Resolved   FIRST Problems (from 05/08/17 to present)    Problem Noted Resolved   Swelling of lower extremity during pregnancy in third trimester 08/25/2017 by Natale Milch, MD No   Hx of herpes genitalis 05/23/2017 by Natale Milch, MD No   Overview Signed 05/23/2017  4:52 PM by Natale Milch, MD     initiate valtrex at 36 weeks      Supervision of normal first pregnancy, antepartum 05/08/2017 by Tresea Mall, CNM No   Overview Addendum 09/13/2017  3:47 PM by Natale Milch, MD      Clinic [facility] Prenatal Labs  Dating  LMP Blood type: O/Positive/-- (02/05 1542)   Genetic Screen NIPS:  Normal XY Antibody:Negative (02/05 1542)  Anatomic [name] complete, XY Rubella: 4.76 (02/05 1542) Varicella: Immune  GTT Early:        28 wk:  112    RPR: Non Reactive (02/05 1542)   Rhogam  Not applicable HBsAg: Negative (02/05 1542)   TDaP vaccine                       HIV: Non Reactive (02/05 1542)   Flu Shot   Declines                             GBS:   Contraception  Condoms Pap:  CBB   Given information   CS/VBAC  Not applicable   Baby Food  Breast   Support Person   husband Simonne Come  Gestational age appropriate obstetric precautions including but not limited to vaginal bleeding, contractions, leaking of fluid and fetal movement were reviewed in detail with the patient.    Persistent leg swelling bilaterally. Blood pressures normal. 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 She is trying to intake more protein. Wearing compression socks at work. Prescription for Boost shakes sent to [facility], low albumin. Encouraged healthy eating habits.  Tdap at next visit. Return in about 2 weeks (around 09/27/2017) for ROB.  Adelene Idler MD [facility], [facility] 09/13/17 3:44 PM

## 2017-09-13 NOTE — Progress Notes (Signed)
Pt reports no problems. Declines tdap for today and would like at a future visit. Blood consent signed today.

## 2017-09-19 ENCOUNTER — Telehealth: Payer: Self-pay

## 2017-09-19 NOTE — Telephone Encounter (Signed)
LMVM to notify OTC Monistat 7 day treatment is listed on safe meds in pregnancy sheet. Pt advised to contact us if she desires apt prior to next visit at which time she can be checked if s&s aren't better.

## 2017-09-19 NOTE — Telephone Encounter (Signed)
Pt thinks she has a yeast infection. She is aware of how to treat but is wondering if it's different since she is pregnant. Cb#[phone]

## 2017-09-27 ENCOUNTER — Ambulatory Visit (INDEPENDENT_AMBULATORY_CARE_PROVIDER_SITE_OTHER): Payer: Medicaid Other | Admitting: Obstetrics and Gynecology

## 2017-09-27 VITALS — BP 110/70 | Wt 153.0 lb

## 2017-09-27 DIAGNOSIS — F1210 Cannabis abuse, uncomplicated: Secondary | ICD-10-CM

## 2017-09-27 DIAGNOSIS — N760 Acute vaginitis: Secondary | ICD-10-CM

## 2017-09-27 DIAGNOSIS — Z8619 Personal history of other infectious and parasitic diseases: Secondary | ICD-10-CM

## 2017-09-27 DIAGNOSIS — R350 Frequency of micturition: Secondary | ICD-10-CM

## 2017-09-27 DIAGNOSIS — O1203 Gestational edema, third trimester: Secondary | ICD-10-CM

## 2017-09-27 MED ORDER — FLUCONAZOLE 150 MG PO TABS: 150.0000 mg | ORAL_TABLET | ORAL | 0 refills | Status: DC

## 2017-09-27 NOTE — Progress Notes (Signed)
Routine Prenatal Care Visit  Subjective  Brittney Roberts is a 27 y.o. G1P0000 at [redacted]w[redacted]d being seen today for ongoing prenatal care.  She is currently monitored for the following issues for this high-risk pregnancy and has Supervision of normal first pregnancy, antepartum; Hx of herpes genitalis; Marijuana abuse; and Swelling of lower extremity during pregnancy in third trimester on their problem list.  ----------------------------------------------------------------------------------- Patient reports urinary frequency and increased vaginal discharge.   Contractions: Not present. Vag. Bleeding: None.  Movement: Present. Denies leaking of fluid.  ----------------------------------------------------------------------------------- The following portions of the patient's history were reviewed and updated as appropriate: allergies, current medications, past family history, past medical history, past social history, past surgical history and problem list. Problem list updated.   Objective  Blood pressure 110/70, weight 153 lb (69.4 kg), last menstrual period 02/08/2017. Pregravid weight 136 lb (61.7 kg) Total Weight Gain 17 lb (7.711 kg) Urinalysis:      Fetal Status: Fetal Heart Rate (bpm): 160 Fundal Height: 32 cm Movement: Present     General:  Alert, oriented and cooperative. Patient is in no acute distress.  Skin: Skin is warm and dry. No rash noted.   Cardiovascular: Normal heart rate noted  Respiratory: Normal respiratory effort, no problems with respiration noted  Abdomen: Soft, gravid, appropriate for gestational age. Pain/Pressure: Absent     Pelvic:  copious white thick discharge        Extremities: Normal range of motion.     ental Status: Normal mood and affect. Normal behavior. Normal judgment and thought content.     Assessment   27 y.o. G1P0000 at [redacted]w[redacted]d by  11/15/2017, by Last Menstrual Period presenting for routine prenatal visit  Plan   FIRST Problems (from 05/08/17  to present)    Problem Noted Resolved   Swelling of lower extremity during pregnancy in third trimester 08/25/2017 by Natale Milch, MD No   Hx of herpes genitalis 05/23/2017 by Natale Milch, MD No   Overview Signed 05/23/2017  4:52 PM by Natale Milch, MD    [ ]  initiate valtrex at 36 weeks      Supervision of normal first pregnancy, antepartum 05/08/2017 by Tresea Mall, CNM No   Overview Addendum 09/13/2017  3:47 PM by Natale Milch, MD      Clinic [facility] Prenatal Labs  Dating  LMP Blood type: O/Positive/-- (02/05 1542)   Genetic Screen NIPS:  Normal XY Antibody:Negative (02/05 1542)  Anatomic [name] complete, XY Rubella: 4.76 (02/05 1542) Varicella: Immune  GTT Early:        28 wk:  112    RPR: Non Reactive (02/05 1542)   Rhogam  Not applicable HBsAg: Negative (02/05 1542)   TDaP vaccine                       HIV: Non Reactive (02/05 1542)   Flu Shot   Declines                             GBS:   Contraception  Condoms Pap:  CBB   Given information   CS/VBAC  Not applicable   Baby Food  Breast   Support Person   husband Simonne Come               Gestational age appropriate obstetric precautions including but not limited to vaginal bleeding, contractions, leaking of fluid and fetal movement were reviewed in  detail with the patient.    Patient taking boost shakes 3 times a day. Has seen an improvement in her swelling. Sh has continued to loose weight which may be related to decreased water retention.  Continue boost supplements Urinary frequency- will send urine culture. Swab for vaginitis today. Diflucan sent for yeast infection.  Return in about 2 weeks (around 10/11/2017) for ROB.  Adelene Idlerhristanna Treshawn Allen MD [facility], [facility] 09/27/17 4:32 PM

## 2017-09-29 LAB — URINE CULTURE

## 2017-09-29 NOTE — Progress Notes (Signed)
Negative, released to mychart

## 2017-09-30 LAB — NUSWAB BV AND CANDIDA, NAA
ATOPOBIUM VAGINAE: HIGH {score} — AB
CANDIDA ALBICANS, NAA: POSITIVE — AB
CANDIDA GLABRATA, NAA: NEGATIVE

## 2017-10-02 ENCOUNTER — Encounter: Payer: Self-pay | Admitting: Obstetrics and Gynecology

## 2017-10-02 ENCOUNTER — Telehealth: Payer: Self-pay | Admitting: Obstetrics and Gynecology

## 2017-10-02 ENCOUNTER — Ambulatory Visit (INDEPENDENT_AMBULATORY_CARE_PROVIDER_SITE_OTHER): Payer: Medicaid Other | Admitting: Obstetrics and Gynecology

## 2017-10-02 VITALS — BP 110/80 | Wt 157.0 lb

## 2017-10-02 DIAGNOSIS — O1203 Gestational edema, third trimester: Secondary | ICD-10-CM

## 2017-10-02 DIAGNOSIS — N898 Other specified noninflammatory disorders of vagina: Secondary | ICD-10-CM

## 2017-10-02 DIAGNOSIS — Z3A33 33 weeks gestation of pregnancy: Secondary | ICD-10-CM

## 2017-10-02 MED ORDER — METRONIDAZOLE 500 MG PO TABS: 500.0000 mg | ORAL_TABLET | Freq: Two times a day (BID) | ORAL | 0 refills | Status: AC

## 2017-10-02 MED ORDER — TERCONAZOLE 0.4 % VA CREA
1.0000 | TOPICAL_CREAM | Freq: Every day | VAGINAL | 1 refills | Status: DC
Start: 2017-10-02 — End: 2017-11-16

## 2017-10-02 NOTE — Telephone Encounter (Signed)
Patient is calling for labs results. Please advise. Work number [phone]

## 2017-10-02 NOTE — Progress Notes (Signed)
Pt c/o pain/cramping in lower abdomen and lower back. Went to work this morning at 7 am and noticed watery discharge.

## 2017-10-02 NOTE — Telephone Encounter (Signed)
Called and discussed with patient, she will come in today for a visit.

## 2017-10-02 NOTE — Progress Notes (Signed)
Routine Prenatal Care Visit  Subjective  Brittney Roberts is a 28 y.o. G1P0000 at [redacted]w[redacted]d being seen today for ongoing prenatal care.  She is currently monitored for the following issues for this low-risk pregnancy and has Supervision of normal first pregnancy, antepartum; Hx of herpes genitalis; Marijuana abuse; and Swelling of lower extremity during pregnancy in third trimester on their problem list.  ----------------------------------------------------------------------------------- Patient reports backache.  Cramping in back and front which feels like period pain. Some watery discharge.  Contractions: Irritability. Vag. Bleeding: None.  Movement: Present. Denies leaking of fluid.  ----------------------------------------------------------------------------------- The following portions of the patient's history were reviewed and updated as appropriate: allergies, current medications, past family history, past medical history, past social history, past surgical history and problem list. Problem list updated.   Objective  Blood pressure 110/80, weight 157 lb (71.2 kg), last menstrual period 02/08/2017. Pregravid weight 136 lb (61.7 kg) Total Weight Gain 21 lb (9.526 kg) Urinalysis: Urine Protein: Negative Urine Glucose: Negative  Fetal Status: Fetal Heart Rate (bpm): 150 Fundal Height: 33 cm Movement: Present     General:  Alert, oriented and cooperative. Patient is in no acute distress.  Skin: Skin is warm and dry. No rash noted.   Cardiovascular: Normal heart rate noted  Respiratory: Normal respiratory effort, no problems with respiration noted  Abdomen: Soft, gravid, appropriate for gestational age. Pain/Pressure: Present     Pelvic:  Cervical exam performed Dilation: Closed Effacement (%): Thick Station: -3  Extremities: Normal range of motion.     ental Status: Normal mood and affect. Normal behavior. Normal judgment and thought content.     Assessment   27 y.o. G1P0000 at  [redacted]w[redacted]d by  11/15/2017, by Last Menstrual Period presenting for routine prenatal visit  Plan   FIRST Problems (from 05/08/17 to present)    Problem Noted Resolved   Swelling of lower extremity during pregnancy in third trimester 08/25/2017 by Natale Milch, MD No   Hx of herpes genitalis 05/23/2017 by Natale Milch, MD No   Overview Signed 05/23/2017  4:52 PM by Natale Milch, MD    [ ]  initiate valtrex at 36 weeks      Supervision of normal first pregnancy, antepartum 05/08/2017 by Tresea Mall, CNM No   Overview Addendum 09/13/2017  3:47 PM by Natale Milch, MD      Clinic [facility] Prenatal Labs  Dating  LMP Blood type: O/Positive/-- (02/05 1542)   Genetic Screen NIPS:  Normal XY Antibody:Negative (02/05 1542)  Anatomic [name] complete, XY Rubella: 4.76 (02/05 1542) Varicella: Immune  GTT Early:        28 wk:  112    RPR: Non Reactive (02/05 1542)   Rhogam  Not applicable HBsAg: Negative (02/05 1542)   TDaP vaccine                       HIV: Non Reactive (02/05 1542)   Flu Shot   Declines                             GBS:   Contraception  Condoms Pap:  CBB   Given information   CS/VBAC  Not applicable   Baby Food  Breast   Support Person   husband Simonne Come               Gestational age appropriate obstetric precautions including but not limited to vaginal bleeding, contractions,  leaking of fluid and fetal movement were reviewed in detail with the patient.    Will send FFN to rule out preterm labor. Patient having numbness and pain in arms similar to restless leg syndrome- discussed OTC magnesium supplementation. If is persistent can refer for evaluation postpartum. Will treat vaginitis with Terazol and flagyl.  Return in about 1 week (around 10/09/2017) for rofb in 1 week as planned.  Adelene Idlerhristanna Gisela Lea MD [facility], [facility] 10/02/17 1:35 PM

## 2017-10-02 NOTE — Progress Notes (Signed)
Called, no answer, released to mychart.  Was given script for diflucan at last visit, will treat for BV if she is symptomatic.

## 2017-10-03 LAB — FETAL FIBRONECTIN: Fetal Fibronectin: NEGATIVE

## 2017-10-03 NOTE — Progress Notes (Signed)
Negative, released to mychart

## 2017-10-12 ENCOUNTER — Encounter: Payer: Self-pay | Admitting: Obstetrics and Gynecology

## 2017-10-12 ENCOUNTER — Ambulatory Visit (INDEPENDENT_AMBULATORY_CARE_PROVIDER_SITE_OTHER): Payer: Medicaid Other | Admitting: Obstetrics and Gynecology

## 2017-10-12 VITALS — BP 112/78 | Wt 158.0 lb

## 2017-10-12 DIAGNOSIS — Z3A35 35 weeks gestation of pregnancy: Secondary | ICD-10-CM

## 2017-10-12 DIAGNOSIS — Z3400 Encounter for supervision of normal first pregnancy, unspecified trimester: Secondary | ICD-10-CM

## 2017-10-12 DIAGNOSIS — G479 Sleep disorder, unspecified: Secondary | ICD-10-CM

## 2017-10-12 DIAGNOSIS — Z3403 Encounter for supervision of normal first pregnancy, third trimester: Secondary | ICD-10-CM

## 2017-10-12 MED ORDER — ZOLPIDEM TARTRATE 5 MG PO TABS: 5.0000 mg | ORAL_TABLET | Freq: Every evening | ORAL | 1 refills | Status: DC | PRN

## 2017-10-12 NOTE — Progress Notes (Signed)
Routine Prenatal Care Visit  Subjective  Brittney Roberts is a 27 y.o. G1P0000 at 5858w1d being seen today for ongoing prenatal care.  She is currently monitored for the following issues for this low-risk pregnancy and has Supervision of normal first pregnancy, antepartum; Hx of herpes genitalis; Marijuana abuse; and Swelling of lower extremity during pregnancy in third trimester on their problem list.  ----------------------------------------------------------------------------------- Patient reports that her vaginal discharge is improved. She has continued the boot shakes and her Leg swelling is improved.  Contractions: Irritability. Vag. Bleeding: None.  Movement: Present. Denies leaking of fluid.  ----------------------------------------------------------------------------------- The following portions of the patient's history were reviewed and updated as appropriate: allergies, current medications, past family history, past medical history, past social history, past surgical history and problem list. Problem list updated.   Objective  Blood pressure 112/78, weight 158 lb (71.7 kg), last menstrual period 02/08/2017. Pregravid weight 136 lb (61.7 kg) Total Weight Gain 22 lb (9.979 kg) Urinalysis: Urine Protein: Negative Urine Glucose: Negative  Fetal Status: Fetal Heart Rate (bpm): 150 Fundal Height: 35 cm Movement: Present     General:  Alert, oriented and cooperative. Patient is in no acute distress.  Skin: Skin is warm and dry. No rash noted.   Cardiovascular: Normal heart rate noted  Respiratory: Normal respiratory effort, no problems with respiration noted  Abdomen: Soft, gravid, appropriate for gestational age. Pain/Pressure: Present     Pelvic:  Cervical exam deferred        Extremities: Normal range of motion.     ental Status: Normal mood and affect. Normal behavior. Normal judgment and thought content.     Assessment   27 y.o. G1P0000 at 5458w1d by  11/15/2017, by Last  Menstrual Period presenting for routine prenatal visit  Plan   FIRST Problems (from 05/08/17 to present)    Problem Noted Resolved   Swelling of lower extremity during pregnancy in third trimester 08/25/2017 by Natale MilchSchuman, Christanna R, MD No   Hx of herpes genitalis 05/23/2017 by Natale MilchSchuman, Christanna R, MD No   Overview Signed 05/23/2017  4:52 PM by Natale MilchSchuman, Christanna R, MD    [ ]  initiate valtrex at 36 weeks      Supervision of normal first pregnancy, antepartum 05/08/2017 by Tresea MallGledhill, Jane, CNM No   Overview Addendum 09/13/2017  3:47 PM by Natale MilchSchuman, Christanna R, MD      Clinic [facility] Prenatal Labs  Dating  LMP Blood type: O/Positive/-- (02/05 1542)   Genetic Screen NIPS:  Normal XY Antibody:Negative (02/05 1542)  Anatomic US complete, XY Rubella: 4.76 (02/05 1542) Varicella: Immune  GTT Early:        28 wk:  112    RPR: Non Reactive (02/05 1542)   Rhogam  Not applicable HBsAg: Negative (02/05 1542)   TDaP vaccine                       HIV: Non Reactive (02/05 1542)   Flu Shot   Declines                             GBS:   Contraception  Condoms Pap:  CBB   Given information   CS/VBAC  Not applicable   Baby Food  Breast   Support Person   husband Simonne ComeLeo               Gestational age appropriate obstetric precautions including but not limited to vaginal  bleeding, contractions, leaking of fluid and fetal movement were reviewed in detail with the patient.    Ambien prescripion for 5 pill sent for sleeping Return in about 1 week (around 10/19/2017) for ROB.  Adelene Idler MD [facility], [facility] 10/12/17 4:44 PM

## 2017-10-12 NOTE — Progress Notes (Signed)
Pt c/o difficulty sleeping.

## 2017-10-23 ENCOUNTER — Encounter: Payer: Medicaid Other | Admitting: Certified Nurse Midwife

## 2017-10-27 ENCOUNTER — Ambulatory Visit (INDEPENDENT_AMBULATORY_CARE_PROVIDER_SITE_OTHER): Payer: Medicaid Other | Admitting: Advanced Practice Midwife

## 2017-10-27 ENCOUNTER — Encounter: Payer: Self-pay | Admitting: Advanced Practice Midwife

## 2017-10-27 ENCOUNTER — Other Ambulatory Visit (HOSPITAL_COMMUNITY)
Admission: RE | Admit: 2017-10-27 | Discharge: 2017-10-27 | Disposition: A | Discharge status: Home / Self Care | Payer: Medicaid Other | Source: Ambulatory Visit | Attending: Obstetrics and Gynecology | Admitting: Obstetrics and Gynecology

## 2017-10-27 VITALS — BP 110/70 | Wt 162.0 lb

## 2017-10-27 DIAGNOSIS — Z113 Encounter for screening for infections with a predominantly sexual mode of transmission: Secondary | ICD-10-CM | POA: Diagnosis not present

## 2017-10-27 DIAGNOSIS — Z3A37 37 weeks gestation of pregnancy: Secondary | ICD-10-CM

## 2017-10-27 DIAGNOSIS — Z3685 Encounter for antenatal screening for Streptococcus B: Secondary | ICD-10-CM

## 2017-10-27 DIAGNOSIS — O1203 Gestational edema, third trimester: Secondary | ICD-10-CM

## 2017-10-27 NOTE — Progress Notes (Signed)
GBS/Aptima today. Had to r/s last weeks apt.

## 2017-10-27 NOTE — Patient Instructions (Signed)
Vaginal Delivery Vaginal delivery means that you will give birth by pushing your baby out of your birth canal (vagina). A team of health care providers will help you before, during, and after vaginal delivery. Birth experiences are unique for every woman and every pregnancy, and birth experiences vary depending on where you choose to give birth. What should I do to prepare for my baby's birth? Before your baby is born, it is important to talk with your health care provider about:  Your labor and delivery preferences. These may include: ? Medicines that you may be given. ? How you will manage your pain. This might include non-medical pain relief techniques or injectable pain relief such as epidural analgesia. ? How you and your baby will be monitored during labor and delivery. ? Who may be in the labor and delivery room with you. ? Your feelings about surgical delivery of your baby (cesarean delivery, or C-section) if this becomes necessary. ? Your feelings about receiving donated blood through an IV tube (blood transfusion) if this becomes necessary.  Whether you are able: ? To take pictures or videos of the birth. ? To eat during labor and delivery. ? To move around, walk, or change positions during labor and delivery.  What to expect after your baby is born, such as: ? Whether delayed umbilical cord clamping and cutting is offered. ? Who will care for your baby right after birth. ? Medicines or tests that may be recommended for your baby. ? Whether breastfeeding is supported in your hospital or birth center. ? How long you will be in the hospital or birth center.  How any medical conditions you have may affect your baby or your labor and delivery experience.  To prepare for your baby's birth, you should also:  Attend all of your health care visits before delivery (prenatal visits) as recommended by your health care provider. This is important.  Prepare your home for your baby's  arrival. Make sure that you have: ? Diapers. ? Baby clothing. ? Feeding equipment. ? Safe sleeping arrangements for you and your baby.  Install a car seat in your vehicle. Have your car seat checked by a certified car seat installer to make sure that it is installed safely.  Think about who will help you with your new baby at home for at least the first several weeks after delivery.  What can I expect when I arrive at the birth center or hospital? Once you are in labor and have been admitted into the hospital or birth center, your health care provider may:  Review your pregnancy history and any concerns you have.  Insert an IV tube into one of your veins. This is used to give you fluids and medicines.  Check your blood pressure, pulse, temperature, and heart rate (vital signs).  Check whether your bag of water (amniotic sac) has broken (ruptured).  Talk with you about your birth plan and discuss pain control options.  Monitoring Your health care provider may monitor your contractions (uterine monitoring) and your baby's heart rate (fetal monitoring). You may need to be monitored:  Often, but not continuously (intermittently).  All the time or for long periods at a time (continuously). Continuous monitoring may be needed if: ? You are taking certain medicines, such as medicine to relieve pain or make your contractions stronger. ? You have pregnancy or labor complications.  Monitoring may be done by:  Placing a special stethoscope or a handheld monitoring device on your abdomen to   check your baby's heartbeat, and feeling your abdomen for contractions. This method of monitoring does not continuously record your baby's heartbeat or your contractions.  Placing monitors on your abdomen (external monitors) to record your baby's heartbeat and the frequency and length of contractions. You may not have to wear external monitors all the time.  Placing monitors inside of your uterus  (internal monitors) to record your baby's heartbeat and the frequency, length, and strength of your contractions. ? Your health care provider may use internal monitors if he or she needs more information about the strength of your contractions or your baby's heart rate. ? Internal monitors are put in place by passing a thin, flexible wire through your vagina and into your uterus. Depending on the type of monitor, it may remain in your uterus or on your baby's head until birth. ? Your health care provider will discuss the benefits and risks of internal monitoring with you and will ask for your permission before inserting the monitors.  Telemetry. This is a type of continuous monitoring that can be done with external or internal monitors. Instead of having to stay in bed, you are able to move around during telemetry. Ask your health care provider if telemetry is an option for you.  Physical exam Your health care provider may perform a physical exam. This may include:  Checking whether your baby is positioned: ? With the head toward your vagina (head-down). This is most common. ? With the head toward the top of your uterus (head-up or breech). If your baby is in a breech position, your health care provider may try to turn your baby to a head-down position so you can deliver vaginally. If it does not seem that your baby can be born vaginally, your provider may recommend surgery to deliver your baby. In rare cases, you may be able to deliver vaginally if your baby is head-up (breech delivery). ? Lying sideways (transverse). Babies that are lying sideways cannot be delivered vaginally.  Checking your cervix to determine: ? Whether it is thinning out (effacing). ? Whether it is opening up (dilating). ? How low your baby has moved into your birth canal.  What are the three stages of labor and delivery?  Normal labor and delivery is divided into the following three stages: Stage 1  Stage 1 is the  longest stage of labor, and it can last for hours or days. Stage 1 includes: ? Early labor. This is when contractions may be irregular, or regular and mild. Generally, early labor contractions are more than 10 minutes apart. ? Active labor. This is when contractions get longer, more regular, more frequent, and more intense. ? The transition phase. This is when contractions happen very close together, are very intense, and may last longer than during any other part of labor.  Contractions generally feel mild, infrequent, and irregular at first. They get stronger, more frequent (about every 2-3 minutes), and more regular as you progress from early labor through active labor and transition.  Many women progress through stage 1 naturally, but you may need help to continue making progress. If this happens, your health care provider may talk with you about: ? Rupturing your amniotic sac if it has not ruptured yet. ? Giving you medicine to help make your contractions stronger and more frequent.  Stage 1 ends when your cervix is completely dilated to 4 inches (10 cm) and completely effaced. This happens at the end of the transition phase. Stage 2  Once   your cervix is completely effaced and dilated to 4 inches (10 cm), you may start to feel an urge to push. It is common for the body to naturally take a rest before feeling the urge to push, especially if you received an epidural or certain other pain medicines. This rest period may last for up to 1-2 hours, depending on your unique labor experience.  During stage 2, contractions are generally less painful, because pushing helps relieve contraction pain. Instead of contraction pain, you may feel stretching and burning pain, especially when the widest part of your baby's head passes through the vaginal opening (crowning).  Your health care provider will closely monitor your pushing progress and your baby's progress through the vagina during stage 2.  Your  health care provider may massage the area of skin between your vaginal opening and anus (perineum) or apply warm compresses to your perineum. This helps it stretch as the baby's head starts to crown, which can help prevent perineal tearing. ? In some cases, an incision may be made in your perineum (episiotomy) to allow the baby to pass through the vaginal opening. An episiotomy helps to make the opening of the vagina larger to allow more room for the baby to fit through.  It is very important to breathe and focus so your health care provider can control the delivery of your baby's head. Your health care provider may have you decrease the intensity of your pushing, to help prevent perineal tearing.  After delivery of your baby's head, the shoulders and the rest of the body generally deliver very quickly and without difficulty.  Once your baby is delivered, the umbilical cord may be cut right away, or this may be delayed for 1-2 minutes, depending on your baby's health. This may vary among health care providers, hospitals, and birth centers.  If you and your baby are healthy enough, your baby may be placed on your chest or abdomen to help maintain the baby's temperature and to help you bond with each other. Some mothers and babies start breastfeeding at this time. Your health care team will dry your baby and help keep your baby warm during this time.  Your baby may need immediate care if he or she: ? Showed signs of distress during labor. ? Has a medical condition. ? Was born too early (prematurely). ? Had a bowel movement before birth (meconium). ? Shows signs of difficulty transitioning from being inside the uterus to being outside of the uterus. If you are planning to breastfeed, your health care team will help you begin a feeding. Stage 3  The third stage of labor starts immediately after the birth of your baby and ends after you deliver the placenta. The placenta is an organ that develops  during pregnancy to provide oxygen and nutrients to your baby in the womb.  Delivering the placenta may require some pushing, and you may have mild contractions. Breastfeeding can stimulate contractions to help you deliver the placenta.  After the placenta is delivered, your uterus should tighten (contract) and become firm. This helps to stop bleeding in your uterus. To help your uterus contract and to control bleeding, your health care provider may: ? Give you medicine by injection, through an IV tube, by mouth, or through your rectum (rectally). ? Massage your abdomen or perform a vaginal exam to remove any blood clots that are left in your uterus. ? Empty your bladder by placing a thin, flexible tube (catheter) into your bladder. ? Encourage   you to breastfeed your baby. After labor is over, you and your baby will be monitored closely to ensure that you are both healthy until you are ready to go home. Your health care team will teach you how to care for yourself and your baby. This information is not intended to replace advice given to you by your health care provider. Make sure you discuss any questions you have with your health care provider. Document Released: 01/12/2008 Document Revised: 10/23/2015 Document Reviewed: 04/19/2015 Elsevier Interactive Patient Education  2018 [employer]  

## 2017-10-27 NOTE — Progress Notes (Signed)
Routine Prenatal Care Visit  Subjective  Brittney Roberts is a 27 y.o. G1P0000 at 4012w2d being seen today for ongoing prenatal care.  She is currently monitored for the following issues for this low-risk pregnancy and has Supervision of normal first pregnancy, antepartum; Hx of herpes genitalis; Marijuana abuse; and Swelling of lower extremity during pregnancy in third trimester on their problem list.  ----------------------------------------------------------------------------------- Patient reports discomforts of third trimester. Discussion of labor signs and postpartum care. .   Contractions: Not present. Vag. Bleeding: None.  Movement: Present. Denies leaking of fluid.  ----------------------------------------------------------------------------------- The following portions of the patient's history were reviewed and updated as appropriate: allergies, current medications, past family history, past medical history, past social history, past surgical history and problem list. Problem list updated.   Objective  Blood pressure 110/70, weight 162 lb (73.5 kg), last menstrual period 02/08/2017. Pregravid weight 136 lb (61.7 kg) Total Weight Gain 26 lb (11.8 kg) Urinalysis: Urine Protein: Negative Urine Glucose: Negative  Fetal Status: Fetal Heart Rate (bpm): 140 Fundal Height: 37 cm Movement: Present     General:  Alert, oriented and cooperative. Patient is in no acute distress.  Skin: Skin is warm and dry. No rash noted.   Cardiovascular: Normal heart rate noted  Respiratory: Normal respiratory effort, no problems with respiration noted  Abdomen: Soft, gravid, appropriate for gestational age. Pain/Pressure: Present     Pelvic:  GBS and aptima swabs collected        Extremities: Normal range of motion.     Mental Status: Normal mood and affect. Normal behavior. Normal judgment and thought content.   Assessment   27 y.o. G1P0000 at 5912w2d by  11/15/2017, by Last Menstrual Period presenting  for routine prenatal visit  Plan   FIRST Problems (from 05/08/17 to present)    Problem Noted Resolved   Swelling of lower extremity during pregnancy in third trimester 08/25/2017 by Natale MilchSchuman, Christanna R, MD No   Hx of herpes genitalis 05/23/2017 by Natale MilchSchuman, Christanna R, MD No   Overview Signed 05/23/2017  4:52 PM by Natale MilchSchuman, Christanna R, MD    [ ]  initiate valtrex at 36 weeks      Supervision of normal first pregnancy, antepartum 05/08/2017 by Tresea MallGledhill, Imoni Kohen, CNM No   Overview Addendum 09/13/2017  3:47 PM by Natale MilchSchuman, Christanna R, MD      Clinic [facility] Prenatal Labs  Dating  LMP Blood type: O/Positive/-- (02/05 1542)   Genetic Screen NIPS:  Normal XY Antibody:Negative (02/05 1542)  Anatomic US complete, XY Rubella: 4.76 (02/05 1542) Varicella: Immune  GTT Early:        28 wk:  112    RPR: Non Reactive (02/05 1542)   Rhogam  Not applicable HBsAg: Negative (02/05 1542)   TDaP vaccine                       HIV: Non Reactive (02/05 1542)   Flu Shot   Declines                             GBS:   Contraception  Condoms Pap:  CBB   Given information   CS/VBAC  Not applicable   Baby Food  Breast   Support Person   husband Simonne ComeLeo               Term labor symptoms and general obstetric precautions including but not limited to vaginal bleeding, contractions, leaking of  fluid and fetal movement were reviewed in detail with the patient. Please refer to After Visit Summary for other counseling recommendations.   Return in about 1 week (around 11/03/2017) for rob.  Tresea Mall, CNM 10/27/2017 5:08 PM

## 2017-10-29 LAB — STREP GP B NAA: Strep Gp B NAA: NEGATIVE

## 2017-10-31 LAB — CERVICOVAGINAL ANCILLARY ONLY
Chlamydia: NEGATIVE
Neisseria Gonorrhea: NEGATIVE
TRICH (WINDOWPATH): NEGATIVE

## 2017-11-03 ENCOUNTER — Encounter: Payer: Medicaid Other | Admitting: Obstetrics and Gynecology

## 2017-11-07 ENCOUNTER — Encounter: Payer: Medicaid Other | Admitting: Certified Nurse Midwife

## 2017-11-09 ENCOUNTER — Ambulatory Visit (INDEPENDENT_AMBULATORY_CARE_PROVIDER_SITE_OTHER): Payer: Medicaid Other | Admitting: Obstetrics and Gynecology

## 2017-11-09 ENCOUNTER — Encounter: Payer: Self-pay | Admitting: Obstetrics and Gynecology

## 2017-11-09 VITALS — BP 108/74 | Wt 163.0 lb

## 2017-11-09 DIAGNOSIS — Z3A39 39 weeks gestation of pregnancy: Secondary | ICD-10-CM

## 2017-11-09 DIAGNOSIS — Z8619 Personal history of other infectious and parasitic diseases: Secondary | ICD-10-CM

## 2017-11-09 DIAGNOSIS — O1203 Gestational edema, third trimester: Secondary | ICD-10-CM

## 2017-11-09 DIAGNOSIS — Z3400 Encounter for supervision of normal first pregnancy, unspecified trimester: Secondary | ICD-10-CM

## 2017-11-09 MED ORDER — VALACYCLOVIR HCL 500 MG PO TABS: 500.0000 mg | ORAL_TABLET | Freq: Two times a day (BID) | ORAL | 1 refills | Status: DC

## 2017-11-09 NOTE — Progress Notes (Incomplete)
Routine Prenatal Care Visit  Subjective  Brittney Roberts is a 26 y.o. G1P0000 at [redacted]w[redacted]d being seen today for ongoing prenatal care.  She is currently monitored for the following issues for this {Blank single:19197::"high-risk","low-risk"} pregnancy and has Supervision of normal first pregnancy, antepartum; Hx of herpes genitalis; Marijuana abuse; and Swelling of lower extremity during pregnancy in third trimester on their problem list.  ----------------------------------------------------------------------------------- Patient reports {sx:14538}.   Contractions: Not present. Vag. Bleeding: None.  Movement: Present. Denies leaking of fluid.  ----------------------------------------------------------------------------------- The following portions of the patient's history were reviewed and updated as appropriate: allergies, current medications, past family history, past medical history, past social history, past surgical history and problem list. Problem list updated.   Objective  Blood pressure 108/74, weight 163 lb (73.9 kg), last menstrual period 02/08/2017. Pregravid weight 136 lb (61.7 kg) Total Weight Gain 27 lb (12.2 kg) Urinalysis:      Fetal Status: Fetal Heart Rate (bpm): 140 Fundal Height: 39 cm Movement: Present  Presentation: Vertex  General:  Alert, oriented and cooperative. Patient is in no acute distress.  Skin: Skin is warm and dry. No rash noted.   Cardiovascular: Normal heart rate noted  Respiratory: Normal respiratory effort, no problems with respiration noted  Abdomen: Soft, gravid, appropriate for gestational age. Pain/Pressure: Absent     Pelvic:  {Blank single:19197::"Cervical exam performed","Cervical exam deferred"}        Extremities: Normal range of motion.     Mental Status: Normal mood and affect. Normal behavior. Normal judgment and thought content.   Assessment   27 y.o. G1P0000 at [redacted]w[redacted]d by  11/15/2017, by Last Menstrual Period presenting for {Blank  single:19197::"routine","work-in"} prenatal visit  Plan   FIRST Problems (from 05/08/17 to present)    Problem Noted Resolved   Swelling of lower extremity during pregnancy in third trimester 08/25/2017 by Natale Milch, MD No   Hx of herpes genitalis 05/23/2017 by Natale Milch, MD No   Overview Signed 05/23/2017  4:52 PM by Natale Milch, MD    [ ]  initiate valtrex at 36 weeks      Supervision of normal first pregnancy, antepartum 05/08/2017 by Tresea Mall, CNM No   Overview Addendum 09/13/2017  3:47 PM by Natale Milch, MD      [facility]inic [facility] Prenatal Labs  Dating  LMP Blood type: O/Positive/-- (02/05 1542)   Genetic Screen NIPS:  Normal XY Antibody:Negative (02/05 1542)  Anatomic [name] complete, XY Rubella: 4.76 (02/05 1542) Varicella: Immune  GTT Early:        28 wk:  112    RPR: Non Reactive (02/05 1542)   Rhogam  Not applicable HBsAg: Negative (02/05 1542)   TDaP vaccine                       HIV: Non Reactive (02/05 1542)   Flu Shot   Declines                             GBS:   Contraception  Condoms Pap:  CBB   Given information   CS/VBAC  Not applicable   Baby Food  Breast   Support Person   husband Simonne Come               {Blank single:19197::"Term","Preterm"} labor symptoms and general obstetric precautions including but not limited to vaginal bleeding, contractions, leaking of fluid and fetal movement were reviewed in detail with  the patient. Please refer to After Visit Summary for other counseling recommendations.   Return in about 1 week (around 11/16/2017) for Routine Prenatal Appointment.  Thomasene MohairStephen Mackey Varricchio, MD, Merlinda FrederickFACOG [facility], [facility] 11/09/2017 4:24 PM

## 2017-11-09 NOTE — Progress Notes (Signed)
Routine Prenatal Care Visit  Subjective  Brittney Roberts is a 27 y.o. G1P0000 at 7169w1d being seen today for ongoing prenatal care.  She is currently monitored for the following issues for this low-risk pregnancy and has Supervision of normal first pregnancy, antepartum; Hx of herpes genitalis; Marijuana abuse; and Swelling of lower extremity during pregnancy in third trimester on their problem list.  ----------------------------------------------------------------------------------- Patient reports no complaints.   Contractions: Not present. Vag. Bleeding: None.  Movement: Present. Denies leaking of fluid.  ----------------------------------------------------------------------------------- The following portions of the patient's history were reviewed and updated as appropriate: allergies, current medications, past family history, past medical history, past social history, past surgical history and problem list. Problem list updated.   Objective  Blood pressure 108/74, weight 163 lb (73.9 kg), last menstrual period 02/08/2017. Pregravid weight 136 lb (61.7 kg) Total Weight Gain 27 lb (12.2 kg) Urinalysis:      Fetal Status: Fetal Heart Rate (bpm): 140 Fundal Height: 39 cm Movement: Present  Presentation: Vertex  General:  Alert, oriented and cooperative. Patient is in no acute distress.  Skin: Skin is warm and dry. No rash noted.   Cardiovascular: Normal heart rate noted  Respiratory: Normal respiratory effort, no problems with respiration noted  Abdomen: Soft, gravid, appropriate for gestational age. Pain/Pressure: Absent     Pelvic:  Cervical exam deferred        Extremities: Normal range of motion.     Mental Status: Normal mood and affect. Normal behavior. Normal judgment and thought content.   Assessment   27 y.o. G1P0000 at 6169w1d by  11/15/2017, by Last Menstrual Period presenting for routine prenatal visit  Plan   FIRST Problems (from 05/08/17 to present)    Problem Noted  Resolved   Swelling of lower extremity during pregnancy in third trimester 08/25/2017 by Natale MilchSchuman, Christanna R, MD No   Hx of herpes genitalis 05/23/2017 by Natale MilchSchuman, Christanna R, MD No   Overview Signed 05/23/2017  4:52 PM by Natale MilchSchuman, Christanna R, MD    [ ]  initiate valtrex at 36 weeks      Supervision of normal first pregnancy, antepartum 05/08/2017 by Tresea MallGledhill, Jane, CNM No   Overview Addendum 09/13/2017  3:47 PM by Natale MilchSchuman, Christanna R, MD      [facility]inic [facility] Prenatal Labs  Dating  LMP Blood type: O/Positive/-- (02/05 1542)   Genetic Screen NIPS:  Normal XY Antibody:Negative (02/05 1542)  Anatomic US complete, XY Rubella: 4.76 (02/05 1542) Varicella: Immune  GTT Early:        28 wk:  112    RPR: Non Reactive (02/05 1542)   Rhogam  Not applicable HBsAg: Negative (02/05 1542)   TDaP vaccine                       HIV: Non Reactive (02/05 1542)   Flu Shot   Declines                             GBS:   Contraception  Condoms Pap:  CBB   Given information   CS/VBAC  Not applicable   Baby Food  Breast   Support Person   husband Simonne ComeLeo               Term labor symptoms and general obstetric precautions including but not limited to vaginal bleeding, contractions, leaking of fluid and fetal movement were reviewed in detail with the patient. Please refer to  After Visit Summary for other counseling recommendations.   - Rx called in for HSV suppression today.  I could not find a record of prior rx and patient states she is not taking. She has no symptoms today. Gave precautions regarding symptoms.   Return in about 1 week (around 11/16/2017) for Routine Prenatal Appointment.  Thomasene Mohair, MD, [facility], [facility] 11/09/2017 4:24 PM

## 2017-11-16 ENCOUNTER — Encounter: Payer: Self-pay | Admitting: Advanced Practice Midwife

## 2017-11-16 ENCOUNTER — Ambulatory Visit (INDEPENDENT_AMBULATORY_CARE_PROVIDER_SITE_OTHER): Payer: Medicaid Other | Admitting: Advanced Practice Midwife

## 2017-11-16 VITALS — BP 106/72 | Wt 172.0 lb

## 2017-11-16 DIAGNOSIS — B009 Herpesviral infection, unspecified: Secondary | ICD-10-CM

## 2017-11-16 DIAGNOSIS — O98513 Other viral diseases complicating pregnancy, third trimester: Secondary | ICD-10-CM

## 2017-11-16 DIAGNOSIS — Z3A40 40 weeks gestation of pregnancy: Secondary | ICD-10-CM

## 2017-11-16 MED ORDER — ACYCLOVIR 400 MG PO TABS: 400.0000 mg | ORAL_TABLET | Freq: Three times a day (TID) | ORAL | 1 refills | Status: DC

## 2017-11-16 NOTE — Progress Notes (Signed)
Routine Prenatal Care Visit  Subjective  Brittney Roberts is a 27 y.o. G1P0000 at [redacted]w[redacted]d being seen today for ongoing prenatal care.  She is currently monitored for the following issues for this low-risk pregnancy and has Supervision of normal first pregnancy, antepartum; Hx of herpes genitalis; Marijuana abuse; and Swelling of lower extremity during pregnancy in third trimester on their problem list.  ----------------------------------------------------------------------------------- Patient reports she was unable to pick up Rx for Valtrex last week due to expense and not covered by her insurance. She has no complaints of current outbreak.   Contractions: Not present. Vag. Bleeding: None.  Movement: Present. Denies leaking of fluid.  ----------------------------------------------------------------------------------- The following portions of the patient's history were reviewed and updated as appropriate: allergies, current medications, past family history, past medical history, past social history, past surgical history and problem list. Problem list updated.   Objective  Blood pressure 106/72, weight 172 lb (78 kg), last menstrual period 02/08/2017. Pregravid weight 136 lb (61.7 kg) Total Weight Gain 36 lb (16.3 kg) Urinalysis: Urine Protein: Negative Urine Glucose: Negative  Fetal Status: Fetal Heart Rate (bpm): 125   Movement: Present  Presentation: Vertex  General:  Alert, oriented and cooperative. Patient is in no acute distress.  Skin: Skin is warm and dry. No rash noted.   Cardiovascular: Normal heart rate noted  Respiratory: Normal respiratory effort, no problems with respiration noted  Abdomen: Soft, gravid, appropriate for gestational age. Pain/Pressure: Absent     Pelvic:  Cervical exam performed Dilation: Closed Effacement (%): 50 Station: -2 cervix posterior and difficult to assess- able to reach the edge, no herpes lesions seen  Extremities: Normal range of motion.     Mental  Status: Normal mood and affect. Normal behavior. Normal judgment and thought content.   Assessment   27 y.o. G1P0000 at [redacted]w[redacted]d by  11/15/2017, by Last Menstrual Period presenting for routine prenatal visit  Plan   FIRST Problems (from 05/08/17 to present)    Problem Noted Resolved   Swelling of lower extremity during pregnancy in third trimester 08/25/2017 by Natale Milch, MD No   Hx of herpes genitalis 05/23/2017 by Natale Milch, MD No   Overview Signed 05/23/2017  4:52 PM by Natale Milch, MD    [ ]  initiate valtrex at 36 weeks      Supervision of normal first pregnancy, antepartum 05/08/2017 by Tresea Mall, CNM No   Overview Addendum 09/13/2017  3:47 PM by Natale Milch, MD      Clinic [facility]  Dating  LMP Blood type: O/Positive/-- (02/05 1542)   Genetic Screen NIPS:  Normal XY Antibody:Negative (02/05 1542)  Anatomic [name] complete, XY Rubella: 4.76 (02/05 1542) Varicella: Immune  GTT Early:        28 wk:  112    RPR: Non Reactive (02/05 1542)   Rhogam  Not applicable HBsAg: Negative (02/05 1542)   TDaP vaccine                       HIV: Non Reactive (02/05 1542)   Flu Shot   Declines                             GBS:   Contraception  Condoms Pap:  CBB   Given information   CS/VBAC  Not applicable   Baby Food  Breast   Support Person   husband Simonne Come  Term labor symptoms and general obstetric precautions including but not limited to vaginal bleeding, contractions, leaking of fluid and fetal movement were reviewed in detail with the patient. Resent Rx for herpes suppressive therapy.  Return for IOL 8/6 at 5 am.  Tresea MallJane Veeda Virgo, CNM 11/16/2017 4:15 PM

## 2017-11-21 ENCOUNTER — Other Ambulatory Visit: Payer: Self-pay

## 2017-11-21 ENCOUNTER — Inpatient Hospital Stay
Admission: EM | Admit: 2017-11-21 | Discharge: 2017-11-25 | DRG: 787 | Disposition: A | Discharge status: Home / Self Care | Payer: Medicaid Other | Attending: Obstetrics and Gynecology | Admitting: Obstetrics and Gynecology

## 2017-11-21 DIAGNOSIS — O9832 Other infections with a predominantly sexual mode of transmission complicating childbirth: Secondary | ICD-10-CM | POA: Diagnosis present

## 2017-11-21 DIAGNOSIS — D62 Acute posthemorrhagic anemia: Secondary | ICD-10-CM | POA: Diagnosis not present

## 2017-11-21 DIAGNOSIS — Z98891 History of uterine scar from previous surgery: Secondary | ICD-10-CM

## 2017-11-21 DIAGNOSIS — A6000 Herpesviral infection of urogenital system, unspecified: Secondary | ICD-10-CM | POA: Diagnosis present

## 2017-11-21 DIAGNOSIS — O99334 Smoking (tobacco) complicating childbirth: Secondary | ICD-10-CM | POA: Diagnosis present

## 2017-11-21 DIAGNOSIS — O9081 Anemia of the puerperium: Secondary | ICD-10-CM | POA: Diagnosis not present

## 2017-11-21 DIAGNOSIS — F17210 Nicotine dependence, cigarettes, uncomplicated: Secondary | ICD-10-CM | POA: Diagnosis present

## 2017-11-21 DIAGNOSIS — Z3490 Encounter for supervision of normal pregnancy, unspecified, unspecified trimester: Secondary | ICD-10-CM

## 2017-11-21 DIAGNOSIS — Z3A40 40 weeks gestation of pregnancy: Secondary | ICD-10-CM | POA: Diagnosis not present

## 2017-11-21 DIAGNOSIS — O480 Post-term pregnancy: Principal | ICD-10-CM | POA: Diagnosis present

## 2017-11-21 DIAGNOSIS — Z3400 Encounter for supervision of normal first pregnancy, unspecified trimester: Secondary | ICD-10-CM

## 2017-11-21 DIAGNOSIS — Z8619 Personal history of other infectious and parasitic diseases: Secondary | ICD-10-CM

## 2017-11-21 DIAGNOSIS — O1203 Gestational edema, third trimester: Secondary | ICD-10-CM

## 2017-11-21 MED ORDER — MISOPROSTOL 200 MCG PO TABS
ORAL_TABLET | ORAL | Status: AC
  Filled 2017-11-21: qty 4

## 2017-11-21 MED ORDER — OXYTOCIN 10 UNIT/ML IJ SOLN
INTRAMUSCULAR | Status: AC
  Filled 2017-11-21: qty 2

## 2017-11-21 MED ORDER — ONDANSETRON HCL 4 MG/2ML IJ SOLN: 4.0000 mg | Freq: Four times a day (QID) | INTRAMUSCULAR | Status: DC | PRN

## 2017-11-21 MED ORDER — ACYCLOVIR 200 MG PO CAPS
400.0000 mg | ORAL_CAPSULE | Freq: Three times a day (TID) | ORAL | Status: DC
  Administered 2017-11-22 (×2): 400 mg via ORAL
  Filled 2017-11-21 (×3): qty 2

## 2017-11-21 MED ORDER — LIDOCAINE HCL (PF) 1 % IJ SOLN
INTRAMUSCULAR | Status: AC
  Filled 2017-11-21: qty 30

## 2017-11-21 MED ORDER — MISOPROSTOL 25 MCG QUARTER TABLET
25.0000 ug | ORAL_TABLET | ORAL | Status: DC | PRN
  Administered 2017-11-21: 25 ug via VAGINAL

## 2017-11-21 MED ORDER — OXYTOCIN BOLUS FROM INFUSION: 500.0000 mL | Freq: Once | INTRAVENOUS | Status: DC

## 2017-11-21 MED ORDER — TERBUTALINE SULFATE 1 MG/ML IJ SOLN
0.2500 mg | Freq: Once | INTRAMUSCULAR | Status: AC | PRN
  Administered 2017-11-22: 0.25 mg via SUBCUTANEOUS

## 2017-11-21 MED ORDER — LACTATED RINGERS IV SOLN
INTRAVENOUS | Status: DC
  Administered 2017-11-22 (×3): via INTRAVENOUS

## 2017-11-21 MED ORDER — PRENATAL MULTIVITAMIN CH: 1.0000 | ORAL_TABLET | Freq: Every day | ORAL | Status: DC

## 2017-11-21 MED ORDER — LACTATED RINGERS IV SOLN
500.0000 mL | INTRAVENOUS | Status: DC | PRN
  Administered 2017-11-22: 500 mL via INTRAVENOUS

## 2017-11-21 MED ORDER — OXYTOCIN 40 UNITS IN LACTATED RINGERS INFUSION - SIMPLE MED
INTRAVENOUS | Status: AC
  Administered 2017-11-22: 1 m[IU]/min via INTRAVENOUS
  Filled 2017-11-21: qty 1000

## 2017-11-21 MED ORDER — MISOPROSTOL 25 MCG QUARTER TABLET
ORAL_TABLET | ORAL | Status: AC
  Filled 2017-11-21: qty 1

## 2017-11-21 MED ORDER — OXYTOCIN 40 UNITS IN LACTATED RINGERS INFUSION - SIMPLE MED
2.5000 [IU]/h | INTRAVENOUS | Status: DC
  Administered 2017-11-22: 500 mL via INTRAVENOUS

## 2017-11-21 MED ORDER — LIDOCAINE HCL (PF) 1 % IJ SOLN: 30.0000 mL | INTRAMUSCULAR | Status: DC | PRN

## 2017-11-21 MED ORDER — DEXTROSE 5 % IN LACTATED RINGERS IV BOLUS
500.0000 mL | Freq: Once | INTRAVENOUS | Status: AC
  Administered 2017-11-21: 500 mL via INTRAVENOUS

## 2017-11-21 MED ORDER — AMMONIA AROMATIC IN INHA
RESPIRATORY_TRACT | Status: AC
  Filled 2017-11-21: qty 10

## 2017-11-21 MED ORDER — SOD CITRATE-CITRIC ACID 500-334 MG/5ML PO SOLN
30.0000 mL | ORAL | Status: DC | PRN
  Filled 2017-11-21: qty 15

## 2017-11-21 NOTE — H&P (Signed)
History and Physical   Brittney Roberts is an 27 y.o. female.  HPI: She presents today for IOL. Denies leakage of fluid, denies vaginal bleeding, denies contrations, reports good fetal movement. No complaints.   FIRST Problems (from 05/08/17 to present)    Problem Noted Resolved   Swelling of lower extremity during pregnancy in third trimester 08/25/2017 by Natale Milch, MD No   Hx of herpes genitalis 05/23/2017 by Natale Milch, MD No   Overview Signed 05/23/2017  4:52 PM by Natale Milch, MD     initiated valtrex at 40 weeks because of insurance difficulties      Supervision of normal first pregnancy, antepartum 05/08/2017 by Tresea Mall, CNM No   Overview Addendum 09/13/2017  3:47 PM by Natale Milch, MD      Clinic [facility] P[facility]  Dating  LMP Blood type: O/Positive/-- (02/05 1542)   Genetic Screen NIPS:  Normal XY Antibody:Negative (02/05 1542)  Anatomic [name] complete, XY Rubella: 4.76 (02/05 1542) Varicella: Immune  GTT Early:        28 wk:  112    RPR: Non Reactive (02/05 1542)   Rhogam  Not applicable HBsAg: Negative (02/05 1542)   TDaP vaccine                       HIV: Non Reactive (02/05 1542)   Flu Shot   Declines                             GBS: Negative  Contraception  Condoms Pap: 2017 NEG  CBB   Given information   CS/VBAC  Not applicable   Baby Food  Breast   Support Person   husband Simonne Come               Past Medical History:  Diagnosis Date  . Asthma   . Chlamydia 05/2012   [facility]  . Herpes genitalia   . Pelvic inflammatory disease 09/2012   [facility]  . Polycystic ovaries     Past Surgical History:  Procedure Laterality Date  . HAND SURGERY Left     Family History  Problem Relation Age of Onset  . Migraines Mother   . Alzheimer's disease Maternal Grandmother   . Arthritis Maternal Grandmother   . Diabetes Maternal Grandmother   . Heart disease Maternal Grandmother   . Hyperlipidemia Maternal  Grandmother   . Hypertension Maternal Grandmother   . Arthritis Maternal Grandfather   . Bipolar disorder Maternal Grandfather   . Heart disease Maternal Grandfather        MYOCARD. INFARCT-OLD/HEALED  . Hyperlipidemia Maternal Grandfather   . Hypertension Maternal Grandfather   . Cancer Paternal Aunt        BREAST    Social History:  reports that she has been smoking cigarettes.  She has a 1.25 pack-year smoking history. She has never used smokeless tobacco. She reports that she drinks alcohol. She reports that she has current or past drug history. Drug: Marijuana.  Allergies: No Known Allergies  Medications: I have reviewed the patient's current medications.  No results found for this or any previous visit (from the past 48 hour(s)).  No results found.  Review of Systems  Constitutional: Negative for chills, fever, malaise/fatigue and weight loss.  HENT: Negative for congestion, hearing loss and sinus pain.   Eyes: Negative for blurred vision and double vision.  Respiratory: Negative  for cough, sputum production, shortness of breath and wheezing.   Cardiovascular: Negative for chest pain, palpitations, orthopnea and leg swelling.  Gastrointestinal: Negative for abdominal pain, constipation, diarrhea, nausea and vomiting.  Genitourinary: Negative for dysuria, flank pain, frequency, hematuria and urgency.  Musculoskeletal: Negative for back pain, falls and joint pain.  Skin: Negative for itching and rash.  Neurological: Negative for dizziness and headaches.  Psychiatric/Behavioral: Negative for depression, substance abuse and suicidal ideas. The patient is not nervous/anxious.    Blood pressure 105/80, pulse 97, temperature 98.5 F (36.9 C), temperature source Oral, resp. rate 16, height 5\' 3"  (1.6 m), weight 172 lb (78 kg), last menstrual period 02/08/2017. Physical Exam  Nursing note and vitals reviewed. Constitutional: She is oriented to person, place, and time. She appears  well-developed and well-nourished.  HENT:  Head: Normocephalic and atraumatic.  Eyes: Pupils are equal, round, and reactive to light.  Cardiovascular: Normal rate and regular rhythm.  Respiratory: Effort normal and breath sounds normal.  GI: Soft. Bowel sounds are normal.  Genitourinary:  Genitourinary Comments: No vaginal lesions seen. SVE 0/0/-4.   Musculoskeletal: Normal range of motion.  Neurological: She is alert and oriented to person, place, and time.  Skin: Skin is warm and dry.  Psychiatric: She has a normal mood and affect. Her behavior is normal. Judgment and thought content normal.   NST: 130 bpm baseline, moderate variability, 15x15 accelerations, no decelerations. Tocometer : quiet  Infnat is cephalic on bedside US.  Assessment/Plan: 27 yo G1P0000 at 1761w6d 1. IOL for advanced gestational age. Will start with cytotec. I placed the first cytotec.  2. History of herpes initiated Valtrex this week, no visible lesions on speculum exam, denies sensation of any lesions as well.  3. GBS negative.  4. Epidural when desired.  Anola Mcgough R Elliot Simoneaux 11/21/2017, 11:30 PM

## 2017-11-22 ENCOUNTER — Encounter
Admission: EM | Disposition: A | Discharge status: Home / Self Care | Payer: Self-pay | Source: Home / Self Care | Attending: Obstetrics and Gynecology

## 2017-11-22 ENCOUNTER — Inpatient Hospital Stay: Payer: Medicaid Other | Admitting: Anesthesiology

## 2017-11-22 ENCOUNTER — Encounter: Payer: Self-pay | Admitting: Anesthesiology

## 2017-11-22 DIAGNOSIS — Z3A40 40 weeks gestation of pregnancy: Secondary | ICD-10-CM

## 2017-11-22 DIAGNOSIS — Z3490 Encounter for supervision of normal pregnancy, unspecified, unspecified trimester: Secondary | ICD-10-CM

## 2017-11-22 LAB — CBC
HEMATOCRIT: 35.4 % (ref 35.0–47.0)
Hemoglobin: 12.1 g/dL (ref 12.0–16.0)
MCH: 31.4 pg (ref 26.0–34.0)
MCHC: 34.1 g/dL (ref 32.0–36.0)
MCV: 92.1 fL (ref 80.0–100.0)
PLATELETS: 206 10*3/uL (ref 150–440)
RBC: 3.84 MIL/uL (ref 3.80–5.20)
RDW: 13.6 % (ref 11.5–14.5)
WBC: 9.1 10*3/uL (ref 3.6–11.0)

## 2017-11-22 LAB — URINE DRUG SCREEN, QUALITATIVE (ARMC ONLY)
AMPHETAMINES, UR SCREEN: NOT DETECTED
Barbiturates, Ur Screen: NOT DETECTED
CANNABINOID 50 NG, UR ~~LOC~~: POSITIVE — AB
Cocaine Metabolite,Ur ~~LOC~~: NOT DETECTED
MDMA (Ecstasy)Ur Screen: NOT DETECTED
Methadone Scn, Ur: NOT DETECTED
OPIATE, UR SCREEN: NOT DETECTED
Phencyclidine (PCP) Ur S: NOT DETECTED
Tricyclic, Ur Screen: NOT DETECTED

## 2017-11-22 LAB — RAPID HIV SCREEN (HIV 1/2 AB+AG)
HIV 1/2 ANTIBODIES: NONREACTIVE
HIV-1 P24 Antigen - HIV24: NONREACTIVE

## 2017-11-22 LAB — TYPE AND SCREEN
ABO/RH(D): O POS
ANTIBODY SCREEN: NEGATIVE

## 2017-11-22 SURGERY — Surgical Case
Anesthesia: Epidural | Site: Abdomen | Wound class: Clean Contaminated

## 2017-11-22 MED ORDER — BUPIVACAINE 0.25 % ON-Q PUMP DUAL CATH 400 ML
400.0000 mL | INJECTION | Status: DC
  Filled 2017-11-22: qty 400

## 2017-11-22 MED ORDER — FENTANYL CITRATE (PF) 100 MCG/2ML IJ SOLN
INTRAMUSCULAR | Status: AC
  Filled 2017-11-22: qty 2

## 2017-11-22 MED ORDER — OXYTOCIN 40 UNITS IN LACTATED RINGERS INFUSION - SIMPLE MED
1.0000 m[IU]/min | INTRAVENOUS | Status: DC
  Administered 2017-11-22: 1 m[IU]/min via INTRAVENOUS

## 2017-11-22 MED ORDER — FENTANYL 2.5 MCG/ML W/ROPIVACAINE 0.15% IN NS 100 ML EPIDURAL (ARMC)
EPIDURAL | Status: DC | PRN
  Administered 2017-11-22: 12 mL/h via EPIDURAL

## 2017-11-22 MED ORDER — LIDOCAINE-EPINEPHRINE (PF) 1.5 %-1:200000 IJ SOLN
INTRAMUSCULAR | Status: DC | PRN
  Administered 2017-11-22: 3 mL via EPIDURAL

## 2017-11-22 MED ORDER — ONDANSETRON HCL 4 MG/2ML IJ SOLN
INTRAMUSCULAR | Status: DC | PRN
  Administered 2017-11-22: 4 mg via INTRAVENOUS

## 2017-11-22 MED ORDER — CHLOROPROCAINE HCL (PF) 3 % IJ SOLN
INTRAMUSCULAR | Status: DC | PRN
  Administered 2017-11-22 (×3): 5 mL

## 2017-11-22 MED ORDER — FENTANYL 2.5 MCG/ML W/ROPIVACAINE 0.15% IN NS 100 ML EPIDURAL (ARMC): 12.0000 mL/h | EPIDURAL | Status: DC

## 2017-11-22 MED ORDER — BUPIVACAINE HCL (PF) 0.5 % IJ SOLN: 10.0000 mL | Freq: Once | INTRAMUSCULAR | Status: DC

## 2017-11-22 MED ORDER — ONDANSETRON HCL 4 MG/2ML IJ SOLN: 4.0000 mg | Freq: Once | INTRAMUSCULAR | Status: DC | PRN

## 2017-11-22 MED ORDER — BUTORPHANOL TARTRATE 2 MG/ML IJ SOLN
1.0000 mg | Freq: Once | INTRAMUSCULAR | Status: AC
  Administered 2017-11-22: 1 mg via INTRAVENOUS
  Filled 2017-11-22: qty 1

## 2017-11-22 MED ORDER — CEFAZOLIN SODIUM-DEXTROSE 2-4 GM/100ML-% IV SOLN
2.0000 g | INTRAVENOUS | Status: AC
  Administered 2017-11-22: 2 g via INTRAVENOUS
  Filled 2017-11-22: qty 100

## 2017-11-22 MED ORDER — EPHEDRINE 5 MG/ML INJ: 10.0000 mg | INTRAVENOUS | Status: DC | PRN

## 2017-11-22 MED ORDER — KETOROLAC TROMETHAMINE 30 MG/ML IJ SOLN
30.0000 mg | Freq: Four times a day (QID) | INTRAMUSCULAR | Status: DC
  Administered 2017-11-22 – 2017-11-23 (×2): 30 mg via INTRAVENOUS
  Filled 2017-11-22 (×2): qty 1

## 2017-11-22 MED ORDER — PHENYLEPHRINE 40 MCG/ML (10ML) SYRINGE FOR IV PUSH (FOR BLOOD PRESSURE SUPPORT): 80.0000 ug | PREFILLED_SYRINGE | INTRAVENOUS | Status: DC | PRN

## 2017-11-22 MED ORDER — SODIUM CHLORIDE FLUSH 0.9 % IV SOLN
INTRAVENOUS | Status: AC
  Filled 2017-11-22: qty 40

## 2017-11-22 MED ORDER — FENTANYL 2.5 MCG/ML W/ROPIVACAINE 0.15% IN NS 100 ML EPIDURAL (ARMC)
EPIDURAL | Status: AC
  Filled 2017-11-22: qty 100

## 2017-11-22 MED ORDER — TERBUTALINE SULFATE 1 MG/ML IJ SOLN
INTRAMUSCULAR | Status: AC
  Filled 2017-11-22: qty 1

## 2017-11-22 MED ORDER — ONDANSETRON HCL 4 MG/2ML IJ SOLN
INTRAMUSCULAR | Status: AC
  Filled 2017-11-22: qty 2

## 2017-11-22 MED ORDER — HYDROMORPHONE HCL 1 MG/ML IJ SOLN
2.0000 mg | Freq: Once | INTRAMUSCULAR | Status: AC
  Administered 2017-11-22: 2 mg via INTRAVENOUS
  Filled 2017-11-22: qty 2

## 2017-11-22 MED ORDER — SODIUM CHLORIDE 0.9 % IV SOLN
INTRAVENOUS | Status: DC | PRN
  Administered 2017-11-22 (×2): 5 mL via EPIDURAL

## 2017-11-22 MED ORDER — TERBUTALINE SULFATE 1 MG/ML IJ SOLN
0.2500 mg | Freq: Once | INTRAMUSCULAR | Status: AC | PRN
  Administered 2017-11-22: 0.25 mg via SUBCUTANEOUS

## 2017-11-22 MED ORDER — METHYLERGONOVINE MALEATE 0.2 MG/ML IJ SOLN
INTRAMUSCULAR | Status: DC
Start: 2017-11-22 — End: 2017-11-22
  Filled 2017-11-22: qty 1

## 2017-11-22 MED ORDER — DIPHENHYDRAMINE HCL 50 MG/ML IJ SOLN: 12.5000 mg | INTRAMUSCULAR | Status: DC | PRN

## 2017-11-22 MED ORDER — PHENYLEPHRINE HCL 10 MG/ML IJ SOLN
INTRAMUSCULAR | Status: DC | PRN
  Administered 2017-11-22 (×4): 100 ug via INTRAVENOUS
  Administered 2017-11-22: 200 ug via INTRAVENOUS
  Administered 2017-11-22: 100 ug via INTRAVENOUS

## 2017-11-22 MED ORDER — BUPIVACAINE HCL (PF) 0.5 % IJ SOLN
INTRAMUSCULAR | Status: AC
  Filled 2017-11-22: qty 30

## 2017-11-22 MED ORDER — FENTANYL CITRATE (PF) 250 MCG/5ML IJ SOLN
INTRAMUSCULAR | Status: DC | PRN
  Administered 2017-11-22 (×2): 50 ug via INTRAVENOUS

## 2017-11-22 MED ORDER — FENTANYL CITRATE (PF) 100 MCG/2ML IJ SOLN
25.0000 ug | INTRAMUSCULAR | Status: DC | PRN
  Administered 2017-11-22 (×4): 25 ug via INTRAVENOUS
  Filled 2017-11-22: qty 2

## 2017-11-22 MED ORDER — DIPHENHYDRAMINE HCL 50 MG/ML IJ SOLN
INTRAMUSCULAR | Status: AC
  Filled 2017-11-22: qty 1

## 2017-11-22 MED ORDER — BUPIVACAINE HCL (PF) 0.5 % IJ SOLN
INTRAMUSCULAR | Status: DC | PRN
  Administered 2017-11-22: 10 mL

## 2017-11-22 MED ORDER — DIPHENHYDRAMINE HCL 50 MG/ML IJ SOLN
25.0000 mg | Freq: Once | INTRAMUSCULAR | Status: AC
  Administered 2017-11-22: 25 mg via INTRAVENOUS

## 2017-11-22 MED ORDER — LIDOCAINE HCL 1 % IJ SOLN
INTRAMUSCULAR | Status: DC | PRN
  Administered 2017-11-22: 30 mL via INTRADERMAL

## 2017-11-22 MED ORDER — LACTATED RINGERS IV SOLN: 500.0000 mL | Freq: Once | INTRAVENOUS | Status: DC

## 2017-11-22 MED ORDER — LIDOCAINE HCL (PF) 1 % IJ SOLN
INTRAMUSCULAR | Status: DC | PRN
  Administered 2017-11-22: 2 mL via SUBCUTANEOUS

## 2017-11-22 MED ORDER — SOD CITRATE-CITRIC ACID 500-334 MG/5ML PO SOLN
30.0000 mL | ORAL | Status: AC
  Administered 2017-11-22: 30 mL via ORAL

## 2017-11-22 SURGICAL SUPPLY — 30 items
CANISTER SUCT 3000ML PPV (MISCELLANEOUS) ×3 IMPLANT
CATH KIT ON-Q SILVERSOAK 5IN (CATHETERS) ×6 IMPLANT
CHLORAPREP W/TINT 26ML (MISCELLANEOUS) ×6 IMPLANT
DERMABOND ADVANCED (GAUZE/BANDAGES/DRESSINGS) ×2
DERMABOND ADVANCED .7 DNX12 (GAUZE/BANDAGES/DRESSINGS) ×1 IMPLANT
DRSG OPSITE POSTOP 4X10 (GAUZE/BANDAGES/DRESSINGS) ×3 IMPLANT
ELECT CAUTERY BLADE 6.4 (BLADE) ×3 IMPLANT
ELECT REM PT RETURN 9FT ADLT (ELECTROSURGICAL) ×3
ELECTRODE REM PT RTRN 9FT ADLT (ELECTROSURGICAL) ×1 IMPLANT
GLOVE BIO SURGEON STRL SZ 6.5 (GLOVE) ×6 IMPLANT
GLOVE BIO SURGEONS STRL SZ 6.5 (GLOVE) ×3
GLOVE BIOGEL PI IND STRL 6.5 (GLOVE) ×1 IMPLANT
GLOVE BIOGEL PI IND STRL 7.0 (GLOVE) ×1 IMPLANT
GLOVE BIOGEL PI INDICATOR 6.5 (GLOVE) ×2
GLOVE BIOGEL PI INDICATOR 7.0 (GLOVE) ×2
GOWN STRL REUS W/ TWL LRG LVL3 (GOWN DISPOSABLE) ×1 IMPLANT
GOWN STRL REUS W/ TWL XL LVL3 (GOWN DISPOSABLE) ×2 IMPLANT
GOWN STRL REUS W/TWL LRG LVL3 (GOWN DISPOSABLE) ×2
GOWN STRL REUS W/TWL XL LVL3 (GOWN DISPOSABLE) ×4
NEEDLE HYPO 22GX1.5 SAFETY (NEEDLE) ×3 IMPLANT
NS IRRIG 1000ML POUR BTL (IV SOLUTION) ×3 IMPLANT
PACK C SECTION AR (MISCELLANEOUS) ×3 IMPLANT
PAD OB MATERNITY 4.3X12.25 (PERSONAL CARE ITEMS) ×6 IMPLANT
PAD PREP 24X41 OB/GYN DISP (PERSONAL CARE ITEMS) ×3 IMPLANT
SPONGE LAP 18X18 RF (DISPOSABLE) ×3 IMPLANT
SUT MNCRL AB 4-0 PS2 18 (SUTURE) ×3 IMPLANT
SUT PLAIN 3-0 (SUTURE) ×3 IMPLANT
SUT VIC AB 0 CT1 36 (SUTURE) ×9 IMPLANT
SUT VIC AB 2-0 CT1 36 (SUTURE) ×3 IMPLANT
SYR 30ML LL (SYRINGE) IMPLANT

## 2017-11-22 NOTE — Progress Notes (Signed)
Patient ID: Calvert CantorAloura M Roberts, female   DOB: 07/27/1990, 27 y.o.   MRN: [id number]   Called by Elpidio AnisNM Colleen Gutierrez to evaluate the fetal heart rate tracing. Patient had an extended period of a low baseline at 105bpm. During that time she had moderate variability. She also had a large acceleration with fetal scalp stimulation on examination. She received a dose of terbutaline which helped space out her contractions and the fetal baseline improved to 120 bpm.  She has not made cervical change in 4 hours. There is concern for some cervical swelling. She has not had any evidence of meconium. Her contractions have not yet been adequate.  Discussed options of expectant management for a vaginal delivery versus cesarean section. At this time given the reassuring fetal status she would like to continue with a plan for vaginal delivery. Will recheck cervix in 2 hours to see if any change had been made.   Adelene Idlerhristanna Schuman MD [facility], [facility] 11/22/17 2:03 PM

## 2017-11-22 NOTE — Progress Notes (Signed)
Will start pitocin at 1mu. Will plan to recheck cervix at 4pm to access for any cervical change.   Adelene Idlerhristanna Areebah Meinders MD [facility], [facility] 11/22/17 2:42 PM

## 2017-11-22 NOTE — Progress Notes (Signed)
Patient ID: Brittney CantorAloura M Roberts, female   DOB: Jun 06, 1990, 27 y.o.   MRN: [id number]   FHR tracing reviewed.  Patient okay for Stadol.  Second cytotec has not been able to be placed ecause of uterine contraction pattern.   NST: 130 bpm baseline, moderate variability, 15x15 accelerations, no decelerations. Tocometer : 1-2 minutes  Adelene Idlerhristanna Schuman MD [facility], [facility] 11/22/17 5:36 AM

## 2017-11-22 NOTE — Progress Notes (Signed)
Subjective:  Comfortable epidural in place  Objective:   Vitals: Blood pressure 101/68, pulse (!) 150, temperature 97.6 F (36.4 C), temperature source Oral, resp. rate 16, height 5\' 3"  (1.6 m), weight 172 lb (78 kg), last menstrual period 02/08/2017, SpO2 99 %. General: NAD Abdomen:gravid, non-tender Cervical Exam:  Dilation: 6 Effacement (%): 70 Cervical Position: Posterior Station: -2 Presentation: Vertex Exam by:: AMS, MD  FHT: 115, moderate, no accels, early deceleration Toco: q1-34min  Results for orders placed or performed during the hospital encounter of 11/21/17 (from the past 24 hour(s))  CBC     Status: None   Collection Time: 11/21/17 11:33 PM  Result Value Ref Range   WBC 9.1 3.6 - 11.0 K/uL   RBC 3.84 3.80 - 5.20 MIL/uL   Hemoglobin 12.1 12.0 - 16.0 g/dL   HCT 16.1 09.6 - 04.5 %   MCV 92.1 80.0 - 100.0 fL   MCH 31.4 26.0 - 34.0 pg   MCHC 34.1 32.0 - 36.0 g/dL   RDW 40.9 81.1 - 91.4 %   Platelets 206 150 - 440 K/uL  Type and screen [facility]     Status: None   Collection Time: 11/21/17 11:33 PM  Result Value Ref Range   ABO/RH(D) O POS    Antibody Screen NEG    Sample Expiration      11/24/2017 Performed at [facility], [address] [place], [place] [address]   Rapid HIV screen (HIV 1/2 Ab+Ag) ([facility] Only)     Status: None   Collection Time: 11/21/17 11:33 PM  Result Value Ref Range   HIV-1 P24 Antigen - HIV24 NON REACTIVE NON REACTIVE   HIV 1/2 Antibodies NON REACTIVE NON REACTIVE   Interpretation (HIV Ag Ab)      A non reactive test result means that HIV 1 or HIV 2 antibodies and HIV 1 p24 antigen were not detected in the specimen.  Urine Drug Screen, Qualitative ([facility] only)     Status: Abnormal   Collection Time: 11/22/17  5:13 AM  Result Value Ref Range   Tricyclic, Ur Screen NONE DETECTED NONE DETECTED   Amphetamines, Ur Screen NONE DETECTED NONE DETECTED   MDMA (Ecstasy)Ur Screen NONE DETECTED NONE  DETECTED   Cocaine Metabolite,Ur [place] NONE DETECTED NONE DETECTED   Opiate, Ur Screen NONE DETECTED NONE DETECTED   Phencyclidine (PCP) Ur S NONE DETECTED NONE DETECTED   Cannabinoid 50 Ng, Ur [place] POSITIVE (A) NONE DETECTED   Barbiturates, Ur Screen NONE DETECTED NONE DETECTED   Benzodiazepine, Ur Scrn TEST NOT PERFORMED, REAGENT NOT AVAILABLE (A) NONE DETECTED   Methadone Scn, Ur NONE DETECTED NONE DETECTED    Assessment:   27 y.o. G1P0000 [redacted]w[redacted]d postdates IOL  Plan:   1) Labor - expectant management, FSE and IUPC with adequate MVU's  2) Fetus - cat II tracing following epidural. The patient did have rapid cervical change and SROM was tachystystole with improvement in tracing following terbutaline administration.  She has had early deceleration and variables.  Currently receiving amnio-infusion which appear to have ameliorated these.  At 11:00 there was either a change in baseline from 120 to 90 with large acceleration, or an increase in baseline to 150's with deep decelerations to the 90. Currently back to 115-120 baseline with occasional early decelerations.  I discussed given the nature of her fetal heart rate tracing should concern arise of fetal intolerance to labor recommendation would be to proceed with 1lTCS to expedite delivery should operative  delivery not be feasible.   - 1-hr OGTT 112 - 36 lb (16.3 kg)  Vena AustriaAndreas Cephus Tupy, MD, Merlinda FrederickFACOG [facility], [facility] 11/22/2017, 11:48 AM

## 2017-11-22 NOTE — Op Note (Signed)
Cesarean Section Procedure Note 11/22/17  Pre-operative Diagnosis:  1. Arrest of Dilation Post-operative Diagnosis: same, delivered. Procedure: Primary Low Transverse Cesarean Section Surgeon: Adelene Idlerhristanna Schuman MD   Assistant(s): Farrel Connersolleen Gutierrez CNM - No other skilled surgical assistant available. Anesthesia: epidural and 1% lidocaine Estimated Blood Loss: 988 mL Complications: None; patient tolerated the procedure well.   Disposition: PACU - hemodynamically stable. Condition: stable   Findings: A female infant in the cephalic presentation. Amniotic fluid - clear   Birth weight: 7 lbs 1oz Apgars of 2 and 8.  Intact placenta with a three-vessel cord. Grossly normal uterus, tubes and ovaries bilaterally. No intraabdominal adhesions were noted.   Procedure Details    The patient was taken to operating room, identified as the correct patient and the procedure verified as C-Section Delivery. A time out was held and the above information confirmed. After induction of anesthesia, the patient was draped and prepped in the usual sterile manner. A Pfannenstiel incision was made and carried down through the subcutaneous tissue to the fascia. Fascial incision was made and extended transversely with the Mayo scissors. The fascia was separated from the underlying rectus tissue superiorly and inferiorly. The peritoneum was identified and entered bluntly. Peritoneal incision was extended longitudinally. A low transverse hysterotomy was made. The fetus was delivered atraumatically. The umbilical cord was clamped x2 and cut and the infant was handed to the awaiting pediatricians. The placenta was removed intact and appeared normal with a 3-vessel cord. The uterus was exteriorized and cleared of all clot and debris. The hysterotomy was closed with running sutures of 0 Vicryl suture. A second imbricating layer was placed with the same suture. Excellent hemostasis was observed. The uterus was returned to the  abdomen. The pelvis was irrigated and again, excellent hemostasis was noted. The peritoneum was closed with a running stitch of 2-0 Vicryl. The On Q Pain pump System was then placed.  Trocars were placed through the abdominal wall into the subfascial space and these were used to thread the silver soaker cathaters into place.The rectus muscles were inspected and were hemostatic. The rectus fascia was then reapproximated with running sutures of 0-vicryl, with careful placement not to incorporate the cathaters. Subcutaneous tissues are then irrigated with saline and hemostasis assured with the bovie. The subcutaneous fat was approximated with 3-0 plain and a running stitch.  Skin wass then closed with 4-0 monocryl suture in a subcuticular fashion followed by skin adhesive. The cathaters are flushed each with 5 mL of Bupivicaine and stabilized into place with dressing. Instrument, sponge, and needle counts were correct prior to the abdominal closure and at the conclusion of the case.  The patient tolerated the procedure well and was transferred to the recovery room in stable condition.   Natale Milchhristanna R Schuman MD [facility], [facility] 11/22/17 9:46 PM

## 2017-11-22 NOTE — Anesthesia Preprocedure Evaluation (Signed)
Anesthesia Evaluation  Patient identified by MRN, date of birth, ID band Patient awake    Reviewed: Allergy & Precautions, H&P , NPO status , Patient's Chart, lab work & pertinent test results, reviewed documented beta blocker date and time   History of Anesthesia Complications Negative for: history of anesthetic complications  Airway Mallampati: II  TM Distance: >3 FB Neck ROM: full    Dental  (+) Dental Advidsory Given, Teeth Intact   Pulmonary neg shortness of breath, asthma , neg recent URI, Current Smoker,           Cardiovascular Exercise Tolerance: Good negative cardio ROS       Neuro/Psych negative neurological ROS  negative psych ROS   GI/Hepatic Neg liver ROS, GERD  ,  Endo/Other  negative endocrine ROS  Renal/GU negative Renal ROS  negative genitourinary   Musculoskeletal   Abdominal   Peds  Hematology negative hematology ROS (+)   Anesthesia Other Findings Past Medical History: No date: Asthma 05/2012: Chlamydia     Comment:  [facility]HD No date: Herpes genitalia 09/2012: Pelvic inflammatory disease     Comment:  [facility] No date: Polycystic ovaries   Reproductive/Obstetrics (+) Pregnancy                             Anesthesia Physical Anesthesia Plan  ASA: II  Anesthesia Plan: Epidural   Post-op Pain Management:    Induction:   PONV Risk Score and Plan:   Airway Management Planned:   Additional Equipment:   Intra-op Plan:   Post-operative Plan:   Informed Consent: I have reviewed the patients History and Physical, chart, labs and discussed the procedure including the risks, benefits and alternatives for the proposed anesthesia with the patient or authorized representative who has indicated his/her understanding and acceptance.   Dental Advisory Given  Plan Discussed with: Anesthesiologist, CRNA and Surgeon  Anesthesia Plan Comments:          Anesthesia Quick Evaluation

## 2017-11-22 NOTE — Progress Notes (Signed)
Patient ID: Brittney Roberts, female   DOB: 1990/12/31, 27 y.o.   MRN: [id number]  Repeat cervical exam by CNM showed no cervical change over a 7 hour period. She has met the criteria for arrest of labor without adequate contractions. Recommended performing a cesarean section for arrest with Sharie. She is discussing this with her partner.   Adrian Prows MD [facility], [facility] 11/22/17 4:11 PM

## 2017-11-22 NOTE — Anesthesia Procedure Notes (Signed)
Performed by: Bita Cartwright, CRNA Pre-anesthesia Checklist: Patient identified, Emergency Drugs available, Suction available, Patient being monitored and Timeout performed Oxygen Delivery Method: Nasal cannula       

## 2017-11-22 NOTE — Discharge Instructions (Signed)
° °Breast Pumping Tips °If you are breastfeeding, there may be times when you cannot feed your baby directly. Returning to work or going on a trip are common examples. Pumping allows you to store breast milk and feed it to your baby later. °You may not get much milk when you first start to pump. Your breasts should start to make more after a few days. If you pump at the times you usually feed your baby, you may be able to keep making enough milk to feed your baby without also using formula. The more often you pump, the more milk you will produce. °When should I pump? °· You can begin to pump soon after delivery. However, some experts recommend waiting about 4 weeks before giving your infant a bottle to make sure breastfeeding is going well. °· If you plan to return to work, begin pumping a few weeks before. This will help you develop techniques that work best for you. It also lets you build up a supply of breast milk. °· When you are with your infant, feed on demand and pump after each feeding. °· When you are away from your infant for several hours, pump for about 15 minutes every 2-3 hours. Pump both breasts at the same time if you can. °· If your infant has a formula feeding, make sure to pump around the same time. °· If you drink any alcohol, wait 2 hours before pumping. °How do I prepare to pump? °Your let-down reflex is the natural reaction to stimulation that makes your breast milk flow. It is easier to stimulate this reflex when you are relaxed. Find relaxation techniques that work for you. If you have difficulty with your let-down reflex, try these methods: °· Smell one of your infant's blankets or an item of clothing. °· Look at a picture or video of your infant. °· Sit in a quiet, private space. °· Massage the breast you plan to pump. °· Place soothing warmth on the breast. °· Play relaxing music. ° °What are some general breast pumping tips? °· Wash your hands before you pump. You do not need to wash  your nipples or breasts. °· There are three ways to pump. °? You can use your hand to massage and compress your breast. °? You can use a handheld manual pump. °? You can use an electric pump. °· Make sure the suction cup (flange) on the breast pump is the right size. Place the flange directly over the nipple. If it is the wrong size or placed the wrong way, it may be painful and cause nipple damage. °· If pumping is uncomfortable, apply a small amount of purified or modified lanolin to your nipple and areola. °· If you are using an electric pump, adjust the speed and suction power to be more comfortable. °· If pumping is painful or if you are not getting very much milk, you may need a different type of pump. A lactation consultant can help you determine what type of pump to use. °· Keep a full water bottle near you at all times. Drinking lots of fluid helps you make more milk. °· You can store your milk to use later. Pumped breast milk can be stored in a sealable, sterile container or plastic bag. Label all stored breast milk with the date you pumped it. °? Milk can stay out at room temperature for up to 8 hours. °? You can store your milk in the refrigerator for up to 8 days. °? You   can store your milk in the freezer for 3 months. Thaw frozen milk using warm water. Do not put it in the microwave. °· Do not smoke. Smoking can lower your milk supply and harm your infant. If you need help quitting, ask your health care provider to recommend a program. °When should I call my health care provider or a lactation consultant? °· You are having trouble pumping. °· You are concerned that you are not making enough milk. °· You have nipple pain, soreness, or redness. °· You want to use birth control. Birth control pills may lower your milk supply. Talk to your health care provider about your options. °This information is not intended to replace advice given to you by your health care provider. Make sure you discuss any  questions you have with your health care provider. °Document Released: 09/22/2009 Document Revised: 09/16/2015 Document Reviewed: 01/25/2013 °Elsevier Interactive Patient Education © 2017 [employer] Inc[employer] °Cesarean Delivery, Care After °Refer to this sheet in the next few weeks. These instructions provide you with information about caring for yourself after your procedure. Your health care provider may also give you more specific instructions. Your treatment has been planned according to current medical practices, but problems sometimes occur. Call your health care provider if you have any problems or questions after your procedure. °What can I expect after the procedure? °After the procedure, it is common to have: °· A small amount of blood or clear fluid coming from the incision. °· Some redness, swelling, and pain in your incision area. °· Some abdominal pain and soreness. °· Vaginal bleeding (lochia). °· Pelvic cramps. °· Fatigue. ° °Follow these instructions at home: °Incision care ° °· Follow instructions from your health care provider about how to take care of your incision. Make sure you: °? Wash your hands with soap and water before you change your bandage (dressing). If soap and water are not available, use hand sanitizer. °? If you have a dressing, change it as told by your health care provider. °? Leave stitches (sutures), skin staples, skin glue, or adhesive strips in place. These skin closures may need to stay in place for 2 weeks or longer. If adhesive strip edges start to loosen and curl up, you may trim the loose edges. Do not remove adhesive strips completely unless your health care provider tells you to do that. °· Check your incision area every day for signs of infection. Check for: °? More redness, swelling, or pain. °? More fluid or blood. °? Warmth. °? Pus or a bad smell. °· When you cough or sneeze, hug a pillow. This helps with pain and decreases the chance of your incision opening up  (dehiscing). Do this until your incision heals. °Medicines °· Take over-the-counter and prescription medicines only as told by your health care provider. °· If you were prescribed an antibiotic medicine, take it as told by your health care provider. Do not stop taking the antibiotic until it is finished. °Driving °· Do not drive or operate heavy machinery while taking prescription pain medicine. °Lifestyle °· Do not drink alcohol. This is especially important if you are breastfeeding or taking pain medicine. °· Do not use tobacco products, including cigarettes, chewing tobacco, or e-cigarettes. If you need help quitting, ask your health care provider. Tobacco can delay wound healing. °Eating and drinking °· Drink at least 8 eight-ounce glasses of water every day unless told not to by your health care provider. If you breastfeed, you may need to drink more water   than this. °· Eat high-fiber foods every day. These foods may help prevent or relieve constipation. High-fiber foods include: °? Whole grain cereals and breads. °? Brown rice. °? Beans. °? Fresh fruits and vegetables. °Activity °· Return to your normal activities as told by your health care provider. Ask your health care provider what activities are safe for you. °· Rest as much as possible. Try to rest or take a nap while your baby is sleeping. °· Do not lift anything that is heavier than your baby or 10 lb (4.5 kg) as told by your health care provider. °· Ask your health care provider when you can engage in sexual activity. This may depend on your: °? Risk of infection. °? Healing rate. °? Comfort and desire to engage in sexual activity. °Bathing °· Do not take baths, swim, or use a hot tub until your health care provider approves. Ask your health care provider if you can take showers. You may only be allowed to take sponge baths until your incision heals. °General instructions °· Do not use tampons or douches until your health care provider  approves. °· Wear: °? Loose, comfortable clothing. °? A supportive and well-fitting bra. °· Watch for any blood clots that may pass from your vagina. These may look like clumps of dark red, brown, or black discharge. °· Keep your perineum clean and dry as told by your health care provider. °· Wipe from front to back when you use the toilet. °· If possible, have someone help you care for your baby and help with household activities for a few days after you leave the hospital. °· Keep all follow-up visits for you and your baby as told by your health care provider. This is important. °Contact a health care provider if: °· You have: °? Bad-smelling vaginal discharge. °? Difficulty urinating. °? Pain when urinating. °? A sudden increase or decrease in the frequency of your bowel movements. °? More redness, swelling, or pain around your incision. °? More fluid or blood coming from your incision. °? Pus or a bad smell coming from your incision. °? A fever. °? A rash. °? Little or no interest in activities you used to enjoy. °? Questions about caring for yourself or your baby. °? Nausea. °· Your incision feels warm to the touch. °· Your breasts turn red or become painful or hard. °· You feel unusually sad or worried. °· You vomit. °· You pass large blood clots from your vagina. If you pass a blood clot, save it to show to your health care provider. Do not flush blood clots down the toilet without showing your health care provider. °· You urinate more than usual. °· You are dizzy or light-headed. °· You have not breastfed and have not had a menstrual period for 12 weeks after delivery. °· You stopped breastfeeding and have not had a menstrual period for 12 weeks after stopping breastfeeding. °Get help right away if: °· You have: °? Pain that does not go away or get better with medicine. °? Chest pain. °? Difficulty breathing. °? Blurred vision or spots in your vision. °? Thoughts about hurting yourself or your baby. °? New  pain in your abdomen or in one of your legs. °? A severe headache. °· You faint. °· You bleed from your vagina so much that you fill two sanitary pads in one hour. °This information is not intended to replace advice given to you by your health care provider. Make sure you discuss any questions   you have with your health care provider. °Document Released: 12/25/2001 Document Revised: 05/07/2016 Document Reviewed: 03/09/2015 °Elsevier Interactive Patient Education © 2018 Elsevier Inc. ° °

## 2017-11-22 NOTE — Anesthesia Procedure Notes (Signed)
Epidural Patient location during procedure: OB Start time: 11/22/2017 8:45 AM End time: 11/22/2017 8:53 AM  Staffing Anesthesiologist: Lenard SimmerKarenz, Jevaun Strick, MD Performed: anesthesiologist   Preanesthetic Checklist Completed: patient identified, site marked, surgical consent, pre-op evaluation, timeout performed, IV checked, risks and benefits discussed and monitors and equipment checked  Epidural Patient position: sitting Prep: ChloraPrep Patient monitoring: heart rate, continuous pulse ox and blood pressure Approach: midline Location: L3-L4 Injection technique: LOR saline  Needle:  Needle type: Tuohy  Needle gauge: 17 G Needle length: 9 cm and 9 Needle insertion depth: 6 cm Catheter type: closed end flexible Catheter size: 19 Gauge Catheter at skin depth: 11 cm Test dose: negative and 1.5% lidocaine with Epi 1:200 K  Assessment Sensory level: T10 Events: blood not aspirated, injection not painful, no injection resistance, negative IV test and no paresthesia  Additional Notes 1st attempt Pt. Evaluated and documentation done after procedure finished. Patient identified. Risks/Benefits/Options discussed with patient including but not limited to bleeding, infection, nerve damage, paralysis, failed block, incomplete pain control, headache, blood pressure changes, nausea, vomiting, reactions to medication both or allergic, itching and postpartum back pain. Confirmed with bedside nurse the patient's most recent platelet count. Confirmed with patient that they are not currently taking any anticoagulation, have any bleeding history or any family history of bleeding disorders. Patient expressed understanding and wished to proceed. All questions were answered. Sterile technique was used throughout the entire procedure. Please see nursing notes for vital signs. Test dose was given through epidural catheter and negative prior to continuing to dose epidural or start infusion. Warning signs of high  block given to the patient including shortness of breath, tingling/numbness in hands, complete motor block, or any concerning symptoms with instructions to call for help. Patient was given instructions on fall risk and not to get out of bed. All questions and concerns addressed with instructions to call with any issues or inadequate analgesia.   Patient tolerated the insertion well without immediate complications.Reason for block:at surgeon's request and procedure for pain

## 2017-11-22 NOTE — Progress Notes (Signed)
L&D Progress Note  S: Very painful, frequent contractions.   O: 141/80 97.8-67-18  General: breathing through contractions, has been standing up and ambulating  FHR: 110 baseline with moderate variability, accelerations to 130. Frequent mild decelerations. Unable to determine when the decelerations are in reference to her contractions Toco: contractions seem to be frequent every 1-2 minutes with some coupling. decelerations appear to be with contractions  Cervical exam by Dr Jerene PitchSchuman: 3cm/70%/posterior  A: IUP at 41 weeks undergoing induction for postdates Progressing after one dose of Cytotec Desires epidural for pain relief Reassuring FH tracing  P: Epidural Continue to monitor progress and fetal-maternal well being

## 2017-11-23 LAB — CBC
HEMATOCRIT: 30.8 % — AB (ref 35.0–47.0)
Hemoglobin: 10.7 g/dL — ABNORMAL LOW (ref 12.0–16.0)
MCH: 31.6 pg (ref 26.0–34.0)
MCHC: 34.8 g/dL (ref 32.0–36.0)
MCV: 90.8 fL (ref 80.0–100.0)
Platelets: 160 10*3/uL (ref 150–440)
RBC: 3.39 MIL/uL — ABNORMAL LOW (ref 3.80–5.20)
RDW: 13.7 % (ref 11.5–14.5)
WBC: 13.8 10*3/uL — AB (ref 3.6–11.0)

## 2017-11-23 LAB — RPR: RPR: NONREACTIVE

## 2017-11-23 MED ORDER — SIMETHICONE 80 MG PO CHEW: 80.0000 mg | CHEWABLE_TABLET | ORAL | Status: DC | PRN

## 2017-11-23 MED ORDER — OXYCODONE-ACETAMINOPHEN 5-325 MG PO TABS
2.0000 | ORAL_TABLET | ORAL | Status: DC | PRN
  Administered 2017-11-23 – 2017-11-25 (×6): 2 via ORAL
  Filled 2017-11-23 (×5): qty 2

## 2017-11-23 MED ORDER — OXYTOCIN 40 UNITS IN LACTATED RINGERS INFUSION - SIMPLE MED
2.5000 [IU]/h | INTRAVENOUS | Status: AC
  Administered 2017-11-23: 2.5 [IU]/h via INTRAVENOUS

## 2017-11-23 MED ORDER — FLEET ENEMA 7-19 GM/118ML RE ENEM: 1.0000 | ENEMA | Freq: Every day | RECTAL | Status: DC | PRN

## 2017-11-23 MED ORDER — WITCH HAZEL-GLYCERIN EX PADS: 1.0000 "application " | MEDICATED_PAD | CUTANEOUS | Status: DC | PRN

## 2017-11-23 MED ORDER — LACTATED RINGERS IV SOLN: INTRAVENOUS | Status: DC

## 2017-11-23 MED ORDER — COCONUT OIL OIL: 1.0000 "application " | TOPICAL_OIL | Status: DC | PRN

## 2017-11-23 MED ORDER — BISACODYL 10 MG RE SUPP: 10.0000 mg | Freq: Every day | RECTAL | Status: DC | PRN

## 2017-11-23 MED ORDER — OXYTOCIN 40 UNITS IN LACTATED RINGERS INFUSION - SIMPLE MED
INTRAVENOUS | Status: AC
  Filled 2017-11-23: qty 1000

## 2017-11-23 MED ORDER — ACETAMINOPHEN 325 MG PO TABS
650.0000 mg | ORAL_TABLET | ORAL | Status: DC | PRN
Start: 2017-11-23 — End: 2017-11-25

## 2017-11-23 MED ORDER — DIPHENHYDRAMINE HCL 25 MG PO CAPS: 25.0000 mg | ORAL_CAPSULE | Freq: Four times a day (QID) | ORAL | Status: DC | PRN

## 2017-11-23 MED ORDER — ZOLPIDEM TARTRATE 5 MG PO TABS: 5.0000 mg | ORAL_TABLET | Freq: Every evening | ORAL | Status: DC | PRN

## 2017-11-23 MED ORDER — PRENATAL MULTIVITAMIN CH
1.0000 | ORAL_TABLET | Freq: Every day | ORAL | Status: DC
  Administered 2017-11-23 – 2017-11-25 (×3): 1 via ORAL
  Filled 2017-11-23 (×3): qty 1

## 2017-11-23 MED ORDER — DIBUCAINE 1 % RE OINT: 1.0000 "application " | TOPICAL_OINTMENT | RECTAL | Status: DC | PRN

## 2017-11-23 MED ORDER — SIMETHICONE 80 MG PO CHEW
80.0000 mg | CHEWABLE_TABLET | ORAL | Status: DC
  Administered 2017-11-24 – 2017-11-25 (×2): 80 mg via ORAL
  Filled 2017-11-23 (×2): qty 1

## 2017-11-23 MED ORDER — OXYCODONE-ACETAMINOPHEN 5-325 MG PO TABS
1.0000 | ORAL_TABLET | ORAL | Status: DC | PRN
  Administered 2017-11-23 – 2017-11-25 (×4): 1 via ORAL
  Filled 2017-11-23 (×6): qty 1

## 2017-11-23 MED ORDER — SIMETHICONE 80 MG PO CHEW
80.0000 mg | CHEWABLE_TABLET | Freq: Three times a day (TID) | ORAL | Status: DC
  Administered 2017-11-23 – 2017-11-25 (×6): 80 mg via ORAL
  Filled 2017-11-23 (×6): qty 1

## 2017-11-23 MED ORDER — SENNOSIDES-DOCUSATE SODIUM 8.6-50 MG PO TABS
2.0000 | ORAL_TABLET | ORAL | Status: DC
  Administered 2017-11-24 – 2017-11-25 (×2): 2 via ORAL
  Filled 2017-11-23 (×2): qty 2

## 2017-11-23 MED ORDER — IBUPROFEN 600 MG PO TABS
600.0000 mg | ORAL_TABLET | Freq: Four times a day (QID) | ORAL | Status: DC
  Administered 2017-11-23 – 2017-11-25 (×7): 600 mg via ORAL
  Filled 2017-11-23 (×7): qty 1

## 2017-11-23 MED ORDER — MENTHOL 3 MG MT LOZG
1.0000 | LOZENGE | OROMUCOSAL | Status: DC | PRN
  Filled 2017-11-23: qty 9

## 2017-11-23 MED ORDER — DONOR BREAST MILK (FOR LABEL PRINTING ONLY): ORAL | Status: DC

## 2017-11-23 MED ORDER — FERROUS SULFATE 325 (65 FE) MG PO TABS
325.0000 mg | ORAL_TABLET | Freq: Two times a day (BID) | ORAL | Status: DC
  Administered 2017-11-24 – 2017-11-25 (×3): 325 mg via ORAL
  Filled 2017-11-23 (×3): qty 1

## 2017-11-23 NOTE — Progress Notes (Signed)
  Subjective:   Post Op Day 1: Patient is doing well. She has not yet gotten out of bed. She is tolerating crackers and feeling hungry/ ready to order breakfast. Foley catheter is intact. Her pain is controlled with IV pain medications and On Q pump.  Objective:  Blood pressure 107/74, pulse 69, temperature 98 F (36.7 C), temperature source Oral, resp. rate 20, height 5\' 3"  (1.6 m), weight 78 kg, last menstrual period 02/08/2017, SpO2 98 %  General: NAD Pulmonary: no increased work of breathing Abdomen: non-distended, non-tender, fundus firm at level of umbilicus Incision: C/D/I Extremities: no edema, no erythema, no tenderness  Results for orders placed or performed during the hospital encounter of 11/21/17 (from the past 24 hour(s))  CBC     Status: Abnormal   Collection Time: 11/23/17  5:23 AM  Result Value Ref Range   WBC 13.8 (H) 3.6 - 11.0 K/uL   RBC 3.39 (L) 3.80 - 5.20 MIL/uL   Hemoglobin 10.7 (L) 12.0 - 16.0 g/dL   HCT 16.130.8 (L) 09.635.0 - 04.547.0 %   MCV 90.8 80.0 - 100.0 fL   MCH 31.6 26.0 - 34.0 pg   MCHC 34.8 32.0 - 36.0 g/dL   RDW 40.913.7 81.111.5 - 91.414.5 %   Platelets 160 150 - 440 K/uL    Intake/Output Summary (Last 24 hours) at 11/23/2017 1240 Last data filed at 11/23/2017 0500 Gross per 24 hour  Intake 934.95 ml  Output 2651 ml  Net -1716.05 ml      Assessment:   27 y.o. G1P1001 postoperativeday # 1   Plan:  1) Acute blood loss anemia - hemodynamically stable and asymptomatic - po ferrous sulfate  2) O positive, Rubella Immune, Varicella Immune  3) TDAP status: needs postpartum  4) Breast/Formula/Contraception: not discussed  5) Disposition: Continue routine post c/section care   Tresea MallJane Jimmy Plessinger, CNM

## 2017-11-23 NOTE — Transfer of Care (Signed)
Immediate Anesthesia Transfer of Care Note  Patient: Brittney Roberts  Procedure(s) Performed: CESAREAN SECTION (N/A Abdomen)  Patient Location: PACU  Anesthesia Type:Epidural  Level of Consciousness: awake, alert  and oriented  Airway & Oxygen Therapy: Patient Spontanous Breathing  Post-op Assessment: Post -op Vital signs reviewed and stable  Post vital signs: stable  Last Vitals:  Vitals Value Taken Time  BP    Temp    Pulse    Resp    SpO2      Last Pain:  Vitals:   11/23/17 0804  TempSrc: Oral  PainSc:       Patients Stated Pain Goal: 0 (11/23/17 0120)  Complications: No apparent anesthesia complications

## 2017-11-23 NOTE — Anesthesia Post-op Follow-up Note (Signed)
Anesthesia QCDR form completed.        

## 2017-11-23 NOTE — Lactation Note (Signed)
This note was copied from a baby's chart. Lactation Consultation Note  Patient Name: Brittney Roberts [id number] Date: 11/23/2017 Reason for consult: Follow-up assessment   Maternal Data    Feeding Feeding Type: Breast Fed  LATCH Score Latch: Too sleepy or reluctant, no latch achieved, no sucking elicited.  Audible Swallowing: None  Type of Nipple: Flat  Comfort (Breast/Nipple): Soft / non-tender  Hold (Positioning): Assistance needed to correctly position infant at breast and maintain latch.  LATCH Score: 4  Interventions Interventions: Skin to skin  Lactation Tools Discussed/Used Tools: Nipple Shields   Consult Status  LC to room to assist with breastfeeding.  Infant attempted to latch but could not hold on to breast. Nipple shield was used but infant did not initiate sucking. LC used gloved finger to assess suck. Infant would bite on finger and did not suck. LC assessed inside infant's mouth and noticed the frenulum under infant's tongue was almost at the tip of the tongue. Mother states that she also has a tight frenulum under her tongue and had to work with speech therapy when she was a child due to an inability to pronounce certain words. LC provided mother with a pump kit and was encouraged to pump every 3 hours to establish colostrum production. Mother was able to pump out several drops of colostrum that was fed to infant. Infant was also fed donor milk via bottle. Mother was given resources on local Ped dentist for evaluation of tongue if she chooses. 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 LC explained the risks of marjiana use in breastmilk and advise mother to avoid marijuana use while breastfeeding. Mother was given a copy of ABM Protocol 21 that talks about substance abuse while breastfeeding. Mother verbalized understanding and denies concerns at this time.    Elvera Lennox 04/19/9288, 2:14 PM

## 2017-11-23 NOTE — Anesthesia Postprocedure Evaluation (Signed)
Anesthesia Post Note  Patient: Brittney Roberts  Procedure(s) Performed: CESAREAN SECTION (N/A Abdomen)  Patient location during evaluation: Mother Baby Anesthesia Type: Epidural Level of consciousness: awake and alert Pain management: pain level controlled Vital Signs Assessment: post-procedure vital signs reviewed and stable Respiratory status: spontaneous breathing, nonlabored ventilation and respiratory function stable Cardiovascular status: stable Postop Assessment: no headache, no backache and epidural receding Anesthetic complications: no     Last Vitals:  Vitals:   11/23/17 0445 11/23/17 0804  BP: 116/81 119/80  Pulse: 75 67  Resp: 18 18  Temp: 36.5 C 36.8 C  SpO2: 98% 99%    Last Pain:  Vitals:   11/23/17 0804  TempSrc: Oral  PainSc:                  Rica MastBachich,  Daiquan Resnik M

## 2017-11-24 ENCOUNTER — Encounter: Payer: Self-pay | Admitting: Obstetrics and Gynecology

## 2017-11-24 NOTE — Progress Notes (Signed)
Spoke with patient, educated patient about potential negative effects on baby associated with continuing to smoke MJ and breastfeed or pump for baby. Patient receptive of education and the recommendation to not smoke MJ while breast feeding.  Hand off report provided to on- coming nurse. Continue to assess.

## 2017-11-24 NOTE — Clinical Social Work Maternal (Signed)
  CLINICAL SOCIAL WORK MATERNAL/CHILD NOTE  Patient Details  Name: Brittney Roberts M Wiberg MRN: [id number] Date of Birth: September 29, 1990  Date:  11/24/2017  Clinical Social Worker Initiating Note:  York SpanielMonica Leydy Worthey MSW,LCSW Date/Time: Initiated:  11/24/17/      Child's Name:      Biological Parents:  Mother, Father   Need for Interpreter:  None   Reason for Referral:  Current Substance Use/Substance Use During Pregnancy    Address:  [address] [place] [address]    Phone number:  [phone] (home) [phone] (work)    Additional phone number: none  Household Members/Support Persons (HM/SP):       HM/SP Name Relationship DOB or Age  HM/SP -1        HM/SP -2        HM/SP -3        HM/SP -4        HM/SP -5        HM/SP -6        HM/SP -7        HM/SP -8          Natural Supports (not living in the home):  Parent   Professional Supports: None   Employment: Full-time   Type of Work:     Education:      Homebound arranged:    Surveyor, quantityinancial Resources:  Medicaid   [facility]  [facility]   Cultural/Religious Considerations Which May Impact Care:  none  Strengths:  Ability to meet basic needs , Home prepared for child    Psychotropic Medications:         Pediatrician:       [place]    [place]    [place]    [place]    [place]    [place]      Pediatrician Fax Number:    Risk Factors/Current Problems:  Substance Use    Cognitive State:  Alert , Able to Concentrate    Mood/Affect:  Happy , Interested    CSW Assessment: CSW asked to see patient due to being positive for marijuana and her newborn being positive as well. CSW spoke with patient and her significant other who was at bedside. Patient gave permission to speak in front of her significant other. Patient and father of baby were very cooperative with assessment. They have all supplies for their newborn and have access to transportation.  They have support systems through other family and friends. Patient confirmed she received education regarding postpartum depression. 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 She stated that after she found out she was pregnant, she began using for nausea symptoms. CSW explained that a DSS Child Protective Report would be made and explained this process. Patient verbalized understanding. Patient nor father of baby had any further questions or concerns.  CSW Plan/Description:  Child Protective Service Report     York SpanielMonica John Williamsen, LCSW 11/24/2017, 11:56 AM

## 2017-11-24 NOTE — Lactation Note (Signed)
This note was copied from a baby's chart. Lactation Consultation Note  Patient Name: Brittney Roberts Reason for consult: Follow-up assessment   Maternal Data    Feeding Feeding Type: Donor Breast Milk  LATCH Score Latch: Grasps breast easily, tongue down, lips flanged, rhythmical sucking.  Audible Swallowing: Spontaneous and intermittent  Type of Nipple: Everted at rest and after stimulation  Comfort (Breast/Nipple): Soft / non-tender  Hold (Positioning): Assistance needed to correctly position infant at breast and maintain latch.  LATCH Score: 9  Interventions Interventions: Support pillows;Adjust position  Lactation Tools Discussed/Used Tools: Nipple Shields Nipple shield size: 16   Consult Status Consult Status: Follow-up Date: 11/24/17 Follow-up type: In-patient LC to assist with breastfeeding using the nipple shield and curved tipped syringe with donor breastmilk. LC assisted with positioning of infant in football hold and allowed parents to breastfeed infant with the curved tipped syringe. Infant breast-fed 10 mL of donor breastmilk and fell asleep. Mother pumped for 15 minutes afterwards.    Brittney Roberts Roberts, 1:07 PM

## 2017-11-24 NOTE — Progress Notes (Signed)
Patient ID: Brittney Roberts, female   DOB: 1990/10/23, 27 y.o.   MRN: [id number]  Admit Date: 11/21/2017 Today's Date: 11/24/2017  Post Partum Day #2  Subjective:  [name] is recovering successfully from her cesarean section. Moderate pain controlled by oral pain medications. Minimal lochia. Breast feeding but supplementing with donor breast milk. + urination. +flatus.   Objective: Temp:  [97.8 F (36.6 C)-98.4 F (36.9 C)] 97.8 F (36.6 C) (08/09 0747) Pulse Rate:  [63-85] 68 (08/09 0747) Resp:  [18-20] 18 (08/09 0747) BP: (107-133)/(71-94) 133/86 (08/09 0747) SpO2:  [98 %-99 %] 99 % (08/09 0747)  Physical Exam:  General: alert and cooperative Lochia: appropriate Uterine Fundus: firm Incision: healing well, no significant drainage, no dehiscence DVT Evaluation: No evidence of DVT seen on physical exam.  Recent Labs    11/21/17 2333 11/23/17 0523  HGB 12.1 10.7*  HCT 35.4 30.8*    Assessment/Plan:   1) Acute blood loss anemia - hemodynamically stable and asymptomatic - po ferrous sulfate  2) O positive, Rubella Immune, Varicella Immune  3) TDAP status: needs postpartum  4) Breastfeeding and using donor milk. Planning condoms for contraception.   5) Disposition: Continue routine post c/section care. Plan for discharge home tomorrow.    LOS: 3 days   Natale Milchhristanna R Schuman [facility] 11/24/2017, 10:05 AM

## 2017-11-25 ENCOUNTER — Encounter: Payer: Self-pay | Admitting: Anesthesiology

## 2017-11-25 DIAGNOSIS — Z98891 History of uterine scar from previous surgery: Secondary | ICD-10-CM

## 2017-11-25 MED ORDER — IBUPROFEN 600 MG PO TABS: 600.0000 mg | ORAL_TABLET | Freq: Four times a day (QID) | ORAL | 1 refills | Status: DC

## 2017-11-25 MED ORDER — OXYCODONE-ACETAMINOPHEN 5-325 MG PO TABS: 1.0000 | ORAL_TABLET | Freq: Four times a day (QID) | ORAL | 0 refills | Status: DC | PRN

## 2017-11-25 MED ORDER — ACETAMINOPHEN 325 MG PO TABS: 650.0000 mg | ORAL_TABLET | Freq: Four times a day (QID) | ORAL | 1 refills | Status: DC | PRN

## 2017-11-25 NOTE — Progress Notes (Signed)
Patient ID: Calvert CantorAloura M Skilton, female   DOB: 02-Dec-1990, 27 y.o.   MRN: [id number] Admit Date: 11/21/2017 Today's Date: 11/25/2017  Subjective: Postpartum Day 3: Cesarean Delivery Patient reports tolerating PO, + flatus, + BM and no problems voiding.    Objective: Vital signs in last 24 hours: Temp:  [98.3 F (36.8 C)-98.7 F (37.1 C)] 98.7 F (37.1 C) (08/10 0738) Pulse Rate:  [78-90] 78 (08/10 0738) Resp:  [16-20] 16 (08/10 0738) BP: (110-142)/(81-91) 142/87 (08/10 0738) SpO2:  [98 %-100 %] 99 % (08/10 0738)  Physical Exam:  General: alert and cooperative Lochia: appropriate Uterine Fundus: firm Incision: healing well, no significant drainage, no dehiscence, no significant erythema DVT Evaluation: No evidence of DVT seen on physical exam.  Recent Labs    11/23/17 0523  HGB 10.7*  HCT 30.8*    Assessment/Plan: Status post Cesarean section. Doing well postoperatively.  Discharge home with standard precautions and return to clinic in 1 week.  1) Acute blood loss anemia - hemodynamically stable and asymptomatic - po ferrous sulfate  2)O positive, Rubella Immune, Varicella Immune  3) TDAP status: needs postpartum  4) Breastfeeding and using donor milk. Planning condoms for contraception.   5) Disposition: Continue routine post c/section care. Plan for discharge home later today after lactation support and plan for donor milk or formula made.  Horace Lukas R Shemeca Lukasik 11/25/2017, 10:06 AM

## 2017-11-25 NOTE — Anesthesia Postprocedure Evaluation (Signed)
Anesthesia Post Note  Patient: Brittney Roberts  Procedure(s) Performed: AN AD HOC LABOR EPIDURAL  Patient location during evaluation: Mother Baby Anesthesia Type: Epidural Level of consciousness: awake and alert Pain management: pain level controlled Vital Signs Assessment: post-procedure vital signs reviewed and stable Respiratory status: spontaneous breathing, nonlabored ventilation and respiratory function stable Cardiovascular status: stable Postop Assessment: no headache, no backache and epidural receding Anesthetic complications: no     Last Vitals:  Vitals:   11/24/17 2328 11/25/17 0738  BP: 110/81 (!) 142/87  Pulse: 90 78  Resp: 20 16  Temp: 36.9 C 37.1 C  SpO2: 100% 99%    Last Pain:  Vitals:   11/25/17 0738  TempSrc: Oral  PainSc:                  Hannah Crill S

## 2017-11-25 NOTE — Progress Notes (Signed)
Reviewed D/C instructions with pt and family. Pt verbalized understanding of teaching. Discharged to home via W/C. Pt to schedule f/u appt.  

## 2017-11-25 NOTE — Lactation Note (Signed)
This note was copied from a baby's chart. Lactation Consultation Note  Patient Name: Brittney Roberts ZOXWR'UToday's Date: 11/25/2017 Reason for consult: Follow-up assessment Mom has decided to formula feed because of her MJ use, wants to feed soy because FOB has lactose intolerance, she may continue to pump breasts, last pumping was around 2 am, I showed her how to use manual Medela pump single and double for use at home.  I encouraged her to pump breasts every 3 hrs and to contact [employer] on Monday to obtain an electric pump if she desires to continue pumping.  [facility] [place]    Maternal Data Formula Feeding for Exclusion: Yes Reason for exclusion: Mother's choice to formula and breast feed on admission;Substance abuse and/or alcohol abuse  Feeding Feeding Type: Bottle Fed - Formula Nipple Type: Slow - flow  LATCH Score                   Interventions    Lactation Tools Discussed/Used WIC Program: Yes Pump Review: Setup, frequency, and cleaning;Milk Storage(shown how to double pump manually) Initiated by:: Cay SchillingsM Daune Divirgilio [facility] IBCLC   Consult Status Consult Status: Complete    Brittney Roberts 11/25/2017, 12:21 PM

## 2017-11-25 NOTE — Plan of Care (Signed)
Patient's vital signs stable; fundus firm; small amount rubra lochia; voiding; good appetite; good po fluids; pain controlled with po motrin and po percocet; pumping breasts; bottle feeding infant with donor breastmilk via bottle; patient viewed [facility] DVD as plans for discharge.

## 2017-11-25 NOTE — Discharge Summary (Signed)
OB Discharge Summary     Patient Name: Brittney Roberts DOB: 1991/03/03 MRN: [id number]  Date of admission: 11/21/2017 Delivering MD: Natale Milchhristanna R Schuman, MD  Date of Delivery: 11/21/2017  Date of discharge: 11/25/2017  Admitting diagnosis: 40 weeks preg Intrauterine pregnancy: 256w0d     Secondary diagnosis: None     Discharge diagnosis: Term Pregnancy Delivered, Failed induction of labor                         Hospital course:  Induction of Labor With Cesarean Section  27 y.o. yo G1P1001 at 256w0d was admitted to the hospital 11/21/2017 for induction of labor. Patient had a labor course significant for arrest of dilation at 6cm. The patient went for cesarean section due to Arrest of Dilation, and delivered a Viable infant,11/22/2017  Membrane Rupture Time/Date: 9:15 AM ,11/22/2017   Details of operation can be found in separate operative Note.  Patient had an uncomplicated postpartum course. She is ambulating, tolerating a regular diet, passing flatus, and urinating well.  Patient is discharged home in stable condition on 11/25/17.                                                                                                   Post partum procedures:none  Complications: None  Physical exam on 11/25/2017: Vitals:   11/24/17 0747 11/24/17 1521 11/24/17 2328 11/25/17 0738  BP: 133/86 (!) 126/91 110/81 (!) 142/87  Pulse: 68 79 90 78  Resp: 18 18 20 16   Temp: 97.8 F (36.6 C) 98.3 F (36.8 C) 98.4 F (36.9 C) 98.7 F (37.1 C)  TempSrc: Oral Oral Oral Oral  SpO2: 99% 98% 100% 99%  Weight:      Height:       General: alert, cooperative and no distress Lochia: appropriate Uterine Fundus: firm Incision: Healing well with no significant drainage DVT Evaluation: No evidence of DVT seen on physical exam.  Labs: Lab Results  Component Value Date   WBC 13.8 (H) 11/23/2017   HGB 10.7 (L) 11/23/2017   HCT 30.8 (L) 11/23/2017   MCV 90.8 11/23/2017   PLT 160 11/23/2017   CMP Latest Ref  Rng & Units 08/30/2017  Glucose 65 - 99 mg/dL 409(W114(H)  BUN 6 - 20 mg/dL 6  Creatinine 1.190.57 - 1.471.00 mg/dL 8.29(F0.48(L)  Sodium 621134 - 308144 mmol/L 140  Potassium 3.5 - 5.2 mmol/L 3.8  Chloride 96 - 106 mmol/L 106  CO2 20 - 29 mmol/L 20  Calcium 8.7 - 10.2 mg/dL 6.5(H8.6(L)  Total Protein 6.0 - 8.5 g/dL 8.4(O5.8(L)  Total Bilirubin 0.0 - 1.2 mg/dL 0.2  Alkaline Phos 39 - 117 IU/L 74  AST 0 - 40 IU/L 28  ALT 0 - 32 IU/L 16    Discharge instruction: per After Visit Summary.  Medications:  Allergies as of 11/25/2017   No Known Allergies     Medication List    TAKE these medications   acetaminophen 325 MG tablet Commonly known as:  TYLENOL Take 2 tablets (650 mg total) by mouth every 6 (six)  hours as needed for mild pain (for pain scale < 4).   acyclovir 400 MG tablet Commonly known as:  ZOVIRAX Take 1 tablet (400 mg total) by mouth 3 (three) times daily.   ibuprofen 600 MG tablet Commonly known as:  ADVIL,MOTRIN Take 1 tablet (600 mg total) by mouth every 6 (six) hours.   multivitamin-prenatal 27-0.8 MG Tabs tablet Take 1 tablet by mouth daily at 12 noon.   oxyCODONE-acetaminophen 5-325 MG tablet Commonly known as:  PERCOCET/ROXICET Take 1 tablet by mouth every 6 (six) hours as needed (pain scale 4-7).   zolpidem 5 MG tablet Commonly known as:  AMBIEN Take 1 tablet (5 mg total) by mouth at bedtime as needed for sleep.            Discharge Care Instructions  (From admission, onward)         Start     Ordered   11/25/17 0000  Discharge wound care:    Comments:  Perform wound care instructions   11/25/17 1419          Diet: routine diet  Activity: Advance as tolerated. Pelvic rest for 6 weeks.   Outpatient follow up: Follow-up Information    Schuman, Jaquelyn Bitter, MD. Schedule an appointment as soon as possible for a visit in 1 week(s).   Specialty:  Obstetrics and Gynecology Contact information: [address] [place] [address] [phone]              Postpartum contraception: Condoms Rhogam Given postpartum: no Rubella vaccine given postpartum: no Varicella vaccine given postpartum: no  Newborn Data: Live born female  Birth Weight: 7 lb 1 oz (3204 g) APGAR: 2, 8  Newborn Delivery   Birth date/time:  11/22/2017 20:22:00 Delivery type:  C-Section, Low Transverse Trial of labor:  Yes C-section categorization:  Primary     Baby Feeding: formula  Disposition:home with mother  SIGNED: Thomasene Mohair, MD 11/25/2017 2:19 PM

## 2017-11-27 ENCOUNTER — Telehealth: Payer: Self-pay

## 2017-11-27 NOTE — Telephone Encounter (Signed)
Pt had c/s last Wed, still has original wrap on it, is nervous to take it off, too soon or not?  [phone].  Pt is afraid she will come open if she takes bandage off.  Adv the sutures inside and the glue is keeping her together not the bandage.  The bandage is to keep would clean.  Pt states bandage feels like it did when it was put on - hasn't soaked thru.  Adv okay to leave bandage on until appt on Thurs this week.  Pt relieved.

## 2017-11-30 ENCOUNTER — Encounter: Payer: Self-pay | Admitting: Obstetrics and Gynecology

## 2017-11-30 ENCOUNTER — Ambulatory Visit (INDEPENDENT_AMBULATORY_CARE_PROVIDER_SITE_OTHER): Payer: Medicaid Other | Admitting: Obstetrics and Gynecology

## 2017-11-30 VITALS — BP 116/74 | HR 74 | Wt 151.0 lb

## 2017-11-30 DIAGNOSIS — Z98891 History of uterine scar from previous surgery: Secondary | ICD-10-CM

## 2017-11-30 NOTE — Progress Notes (Signed)
  OBSTETRICS POSTPARTUM CLINIC PROGRESS NOTE  Subjective:     Brittney Roberts is a 27 y.o. 721P1001 female who presents for a postpartum visit. She is 1 week postpartum following a Term pregnancy and delivery by C-section failure to progress.  I have fully reviewed the prenatal and intrapartum course. Anesthesia: epidural.  Postpartum course has been complicated by uncomplicated.  Baby is feeding by Bottle and breast. Breast feeding has been difficult for Brittney Roberts sicne the infant is tongue tied and her nipples are flat..  Bleeding: patient has not  resumed menses.  Bowel function is normal. Bladder function is normal.  Patient is not sexually active. Contraception method desired is rhythm method.  Postpartum depression screening: negative.  The following portions of the patient's history were reviewed and updated as appropriate: allergies, current medications, past family history, past medical history, past social history and past surgical history.  Review of Systems Pertinent items are noted in HPI.  Objective:    BP 116/74   Pulse 74   Wt 151 lb (68.5 kg)   LMP 02/08/2017 (Exact Date)   Breastfeeding? Yes   BMI 26.75 kg/m   General:  alert and no distress   Breasts:  inspection negative, no nipple discharge or bleeding, no masses or nodularity palpable  Lungs: clear to auscultation bilaterally  Heart:  regular rate and rhythm, S1, S2 normal, no murmur, click, rub or gallop  Abdomen: soft, non-tender; bowel sounds normal; no masses,  no organomegaly.    Well healed Pfannenstiel incision                 Rectal Exam: Not performed.          Assessment:  Post Partum Care visit There are no diagnoses linked to this encounter.  Plan:  See orders and Patient Instructions Follow up in: 1 week or as needed.   Adelene Idlerhristanna Schuman MD [facility], [facility] 11/30/17 5:04 PM

## 2018-01-03 ENCOUNTER — Ambulatory Visit: Payer: Medicaid Other | Admitting: Obstetrics and Gynecology

## 2018-01-08 ENCOUNTER — Ambulatory Visit (INDEPENDENT_AMBULATORY_CARE_PROVIDER_SITE_OTHER): Payer: Medicaid Other | Admitting: Obstetrics and Gynecology

## 2018-01-08 ENCOUNTER — Encounter: Payer: Self-pay | Admitting: Obstetrics and Gynecology

## 2018-01-08 ENCOUNTER — Other Ambulatory Visit (HOSPITAL_COMMUNITY)
Admission: RE | Admit: 2018-01-08 | Discharge: 2018-01-08 | Disposition: A | Discharge status: Home / Self Care | Payer: Medicaid Other | Source: Ambulatory Visit | Attending: Obstetrics and Gynecology | Admitting: Obstetrics and Gynecology

## 2018-01-08 VITALS — BP 114/68 | HR 82 | Ht 63.0 in | Wt 146.0 lb

## 2018-01-08 DIAGNOSIS — O99345 Other mental disorders complicating the puerperium: Secondary | ICD-10-CM

## 2018-01-08 DIAGNOSIS — Z124 Encounter for screening for malignant neoplasm of cervix: Secondary | ICD-10-CM | POA: Insufficient documentation

## 2018-01-08 DIAGNOSIS — F53 Postpartum depression: Secondary | ICD-10-CM

## 2018-01-08 DIAGNOSIS — Z98891 History of uterine scar from previous surgery: Secondary | ICD-10-CM

## 2018-01-08 MED ORDER — ESCITALOPRAM OXALATE 10 MG PO TABS: 10.0000 mg | ORAL_TABLET | Freq: Every day | ORAL | 3 refills | Status: DC

## 2018-01-08 NOTE — Progress Notes (Signed)
  OBSTETRICS POSTPARTUM CLINIC PROGRESS NOTE  Subjective:     Brittney Roberts is a 27 y.o. 601P1001 female who presents for a postpartum visit. She is 7 weeks postpartum following a Term pregnancy and delivery by C-section failure to progress.  I have fully reviewed the prenatal and intrapartum course. Anesthesia: epidural.  Postpartum course has been complicated by uncomplicated.  Baby is feeding by Bottle.  Bleeding: patient has  resumed menses.  Bowel function is normal. Bladder function is normal.  Patient is not sexually active. Contraception method desired is rhythm method.  Postpartum depression screening: positive. Edinburgh 10.  The following portions of the patient's history were reviewed and updated as appropriate: allergies, current medications, past family history, past medical history, past social history, past surgical history and problem list.  Review of Systems Pertinent items are noted in HPI.  Objective:    BP 114/68   Pulse 82   Ht 5\' 3"  (1.6 m)   Wt 146 lb (66.2 kg)   LMP 01/04/2018 (Exact Date)   Breastfeeding? No   BMI 25.86 kg/m   General:  alert and no distress   Breasts:  inspection negative, no nipple discharge or bleeding, no masses or nodularity palpable  Lungs: clear to auscultation bilaterally  Heart:  regular rate and rhythm, S1, S2 normal, no murmur, click, rub or gallop  Abdomen: soft, non-tender; bowel sounds normal; no masses,  no organomegaly.   Well healed Pfannenstiel incision   Vulva:  normal  Vagina: normal vagina, no discharge, exudate, lesion, or erythema  Cervix:  no cervical motion tenderness and no lesions  Corpus: normal size, contour, position, consistency, mobility, non-tender  Adnexa:  normal adnexa and no mass, fullness, tenderness  Rectal Exam: Not performed.          Assessment:  Post Partum Care visit 1. Cervical cancer screening 2. Postpartum depression- will start today on Lexapro, will have her return in 4 weeks.  Note sent to Jola BabinskiMarilyn to help with a counselor.    Plan:  See orders and Patient Instructions Follow up in: 4 weeks or as needed.   Adelene Idlerhristanna Arriah Wadle MD [facility], [facility] 01/08/18 9:54 AM

## 2018-01-10 LAB — CYTOLOGY - PAP: DIAGNOSIS: NEGATIVE

## 2018-01-11 NOTE — Progress Notes (Signed)
Negative, Released to mychart 

## 2018-02-05 ENCOUNTER — Ambulatory Visit: Payer: Medicaid Other | Admitting: Obstetrics and Gynecology

## 2018-02-12 ENCOUNTER — Ambulatory Visit: Payer: Medicaid Other | Admitting: Obstetrics and Gynecology

## 2018-02-19 ENCOUNTER — Ambulatory Visit: Payer: Medicaid Other | Admitting: Obstetrics and Gynecology

## 2018-03-02 ENCOUNTER — Ambulatory Visit: Payer: Medicaid Other | Admitting: Obstetrics and Gynecology

## 2018-05-28 IMAGING — CR DG CHEST 2V
2 series · 2 of 2 positions shown · non-contrast
Comparison: None.

CLINICAL DATA: Cough, central chest pain for 2 days

EXAM:
CHEST  2 VIEW

[chest pa]
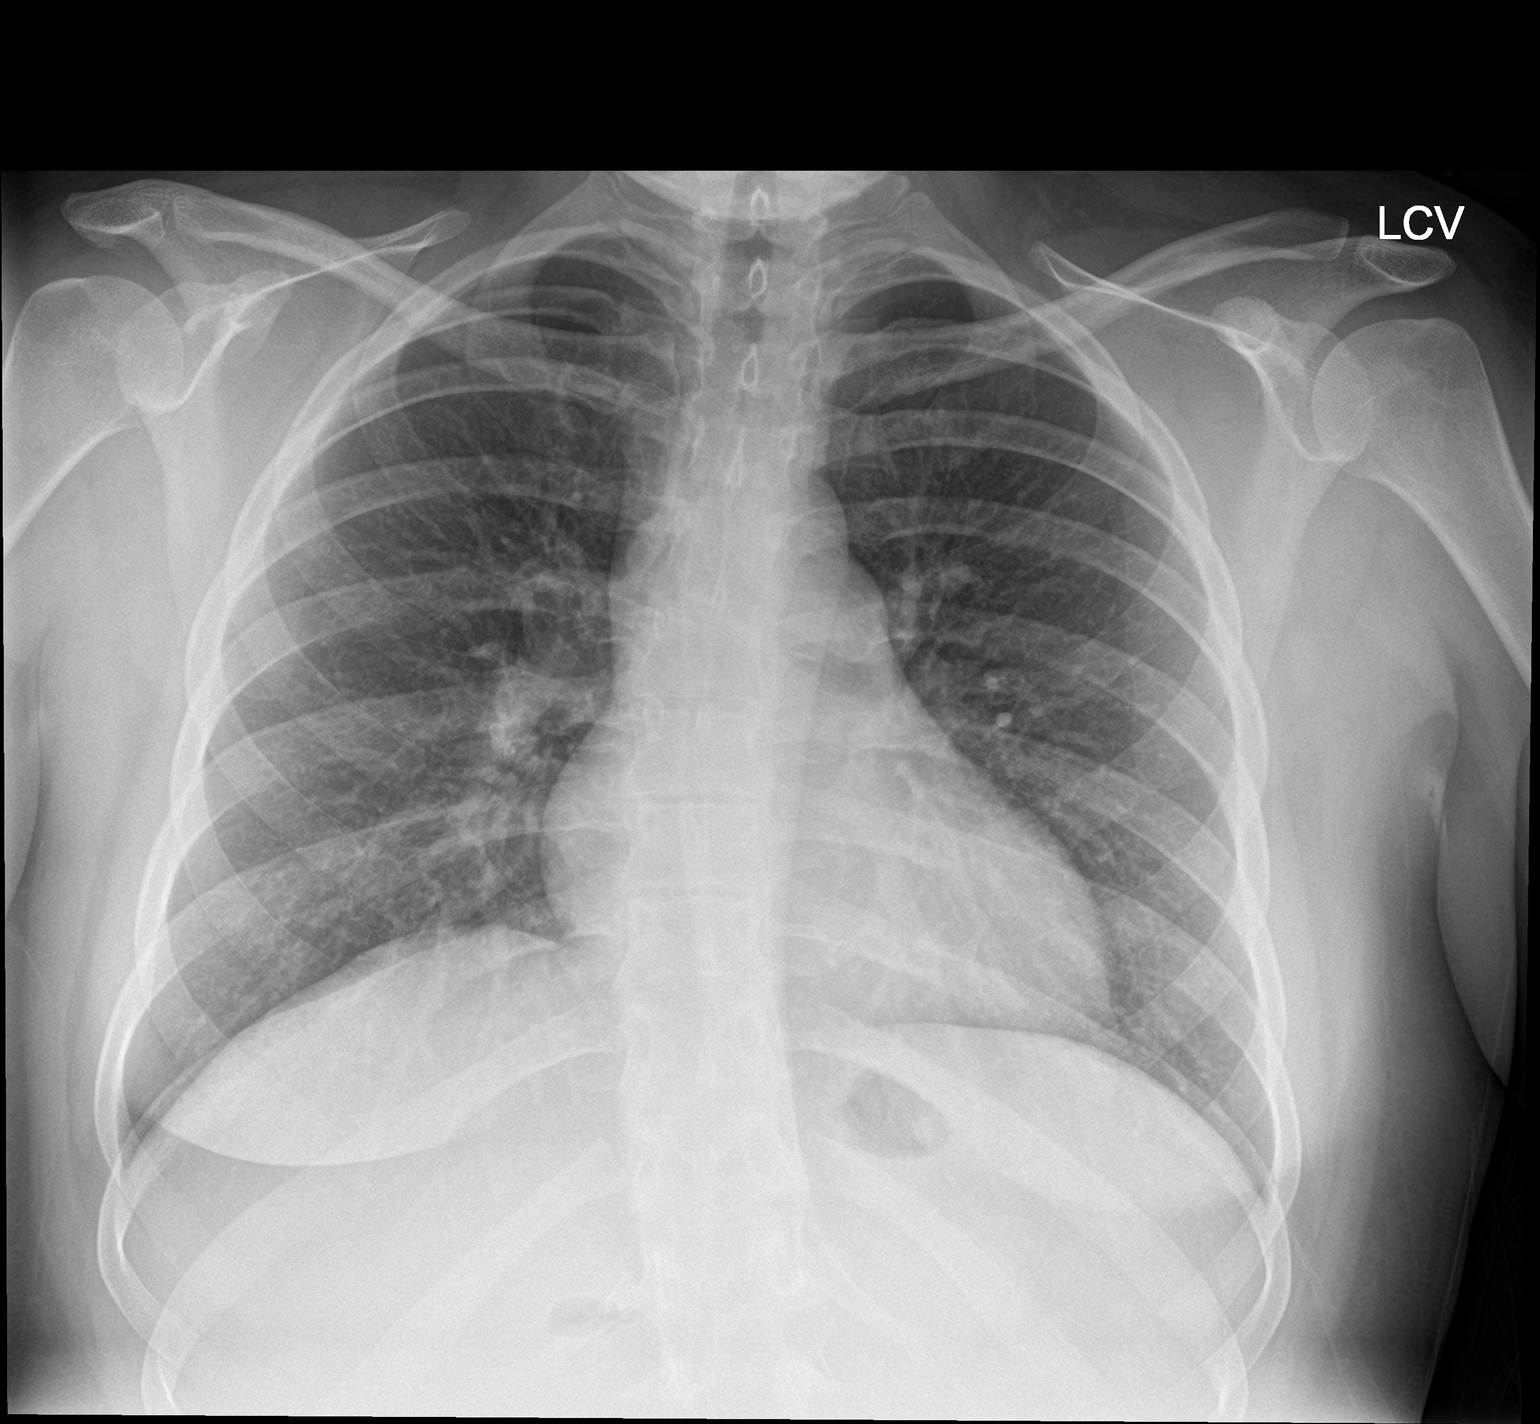

[chest lat]
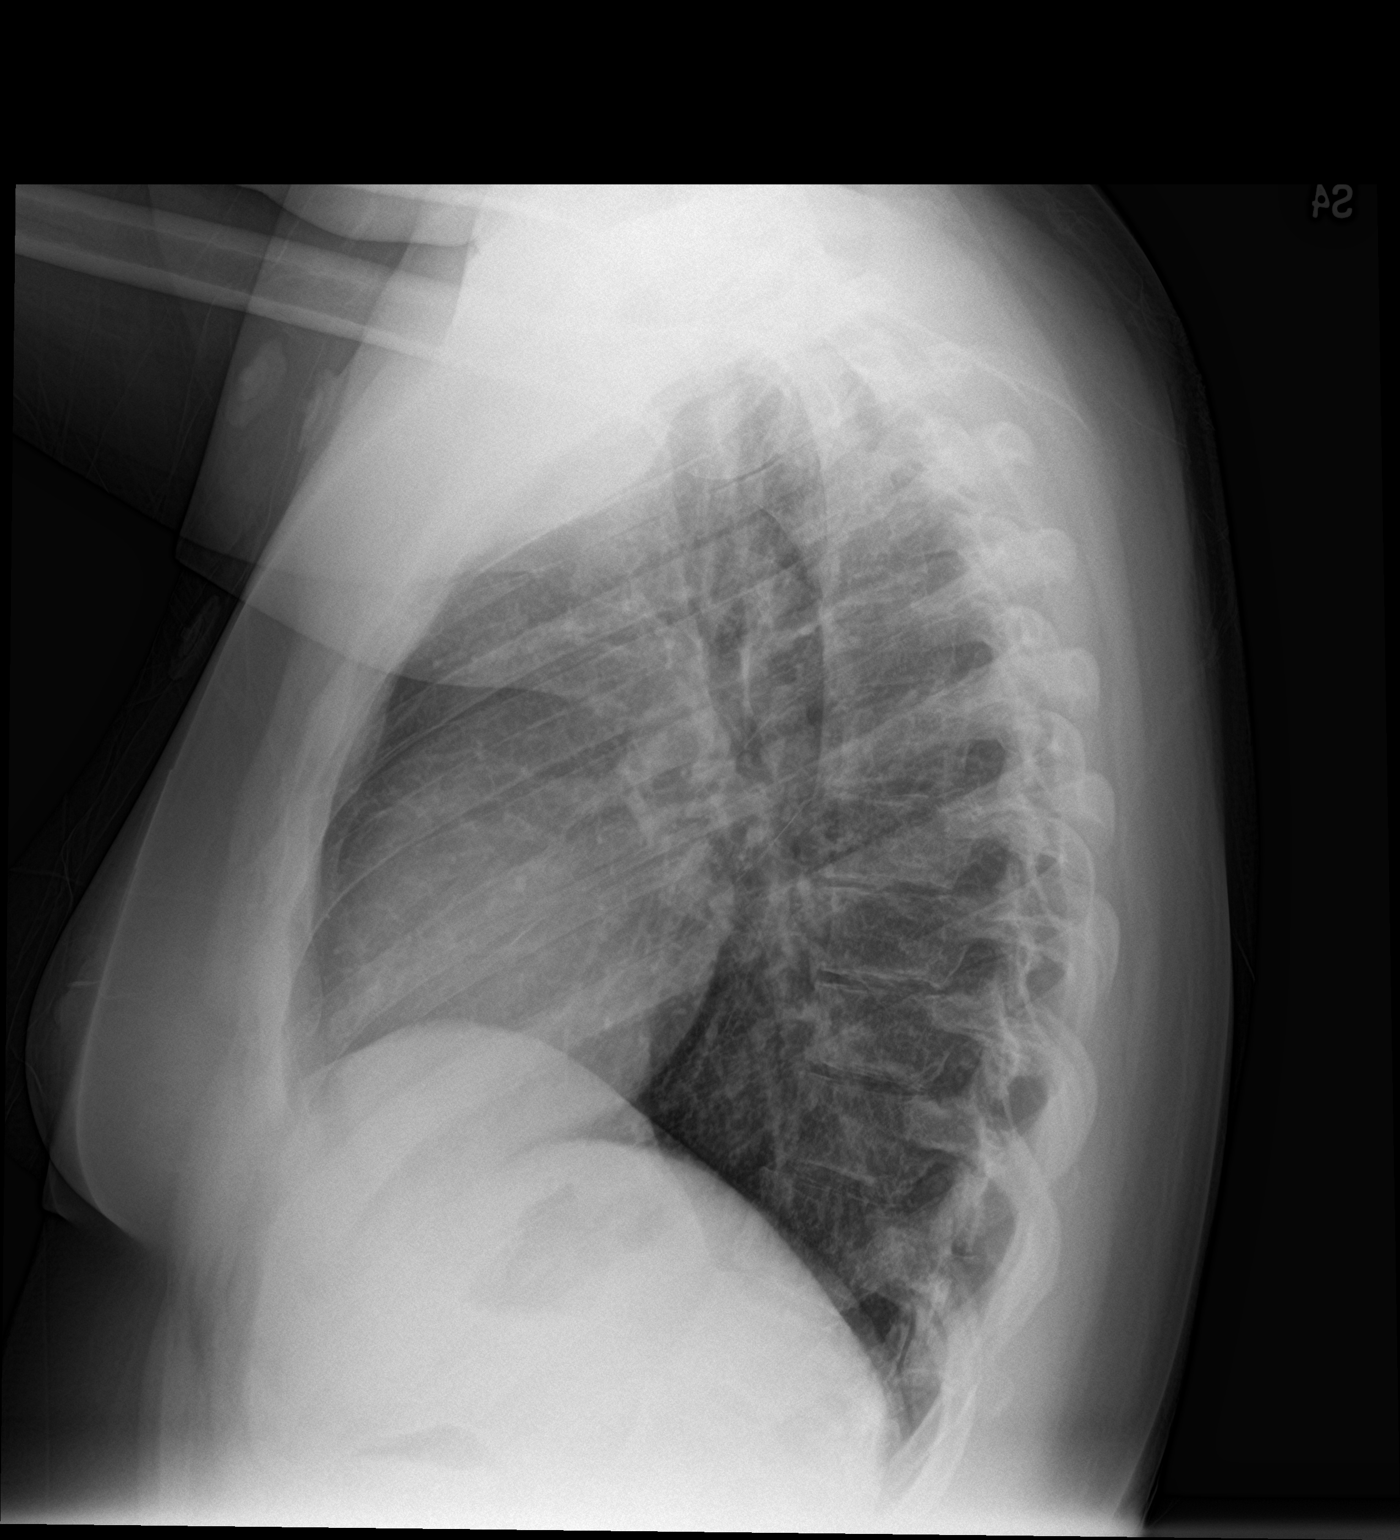

[2 of 2 positions shown; findings below may reference images not displayed]

FINDINGS: Cardiomediastinal silhouette is stable. Mild elevation of the right
hemidiaphragm. No infiltrate or pulmonary edema.
IMPRESSION: No active cardiopulmonary disease.

## 2018-05-28 IMAGING — CT CT ABD-PELV W/ CM
2 of 4 series · 16 of 46 positions shown, 18 images · IV contrast (APPLIED)
Comparison: None.

CLINICAL DATA: Upper abdominal pain, nausea and vomiting starting
this morning

EXAM:
CT ABDOMEN AND PELVIS WITH CONTRAST
TECHNIQUE: Multidetector CT imaging of the abdomen and pelvis was performed
using the standard protocol following bolus administration of
intravenous contrast.
CONTRAST:  100mL [id number] IOPAMIDOL ([id number]) INJECTION 61%

[Series 2: axial st · axial · 0.79mm/px · z∈[-474,-59]mm · 13 of 91 slices shown, 15 images]
[im 4/91  soft-tissue]
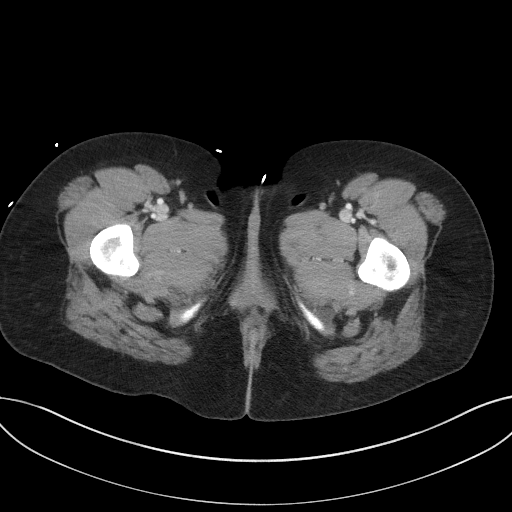
[im 4/91  bone]
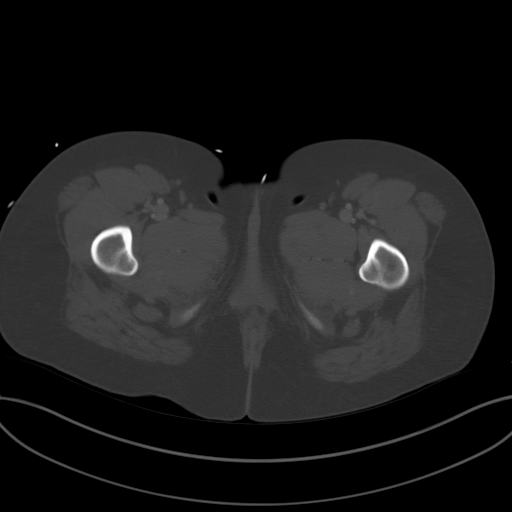
[im 11/91  soft-tissue]
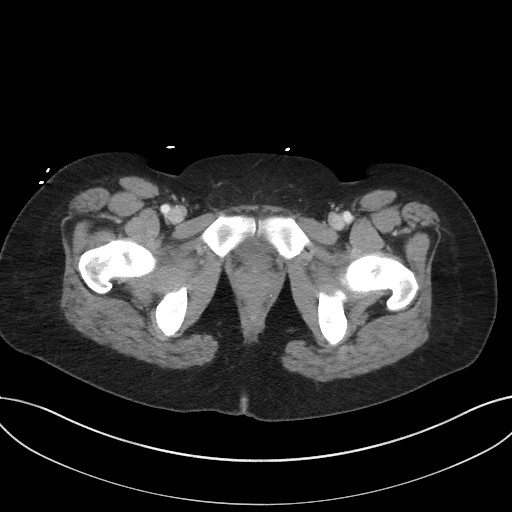
[im 19/91  soft-tissue]
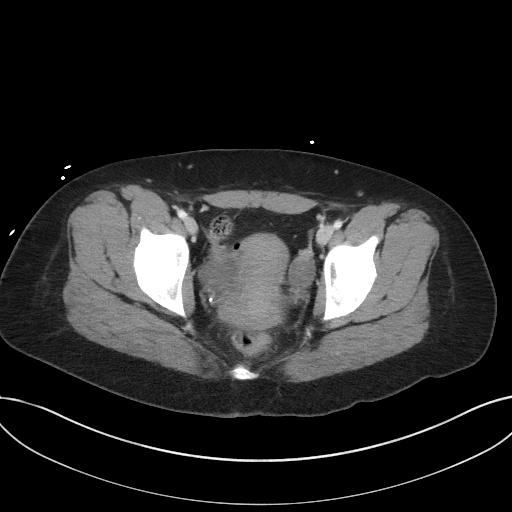
[im 26/91  soft-tissue]
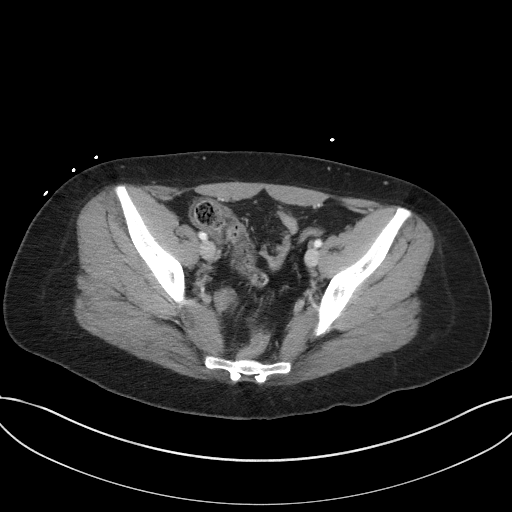
[im 33/91  soft-tissue]
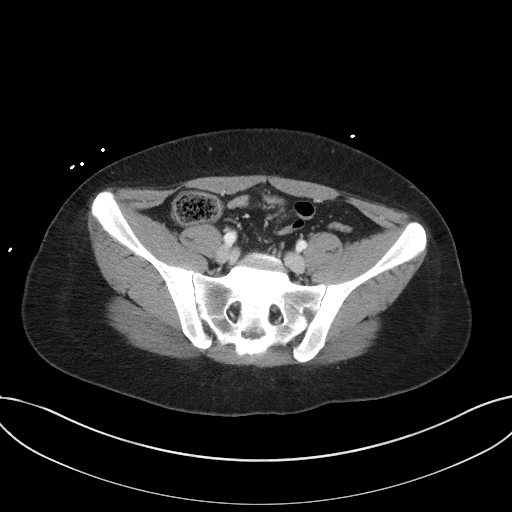
[im 40/91  soft-tissue]
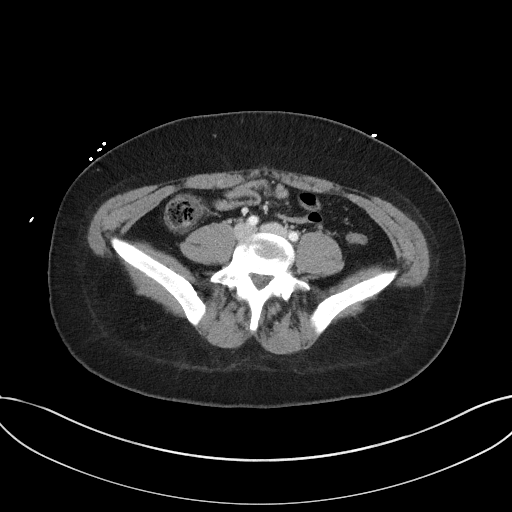
[im 47/91  soft-tissue]
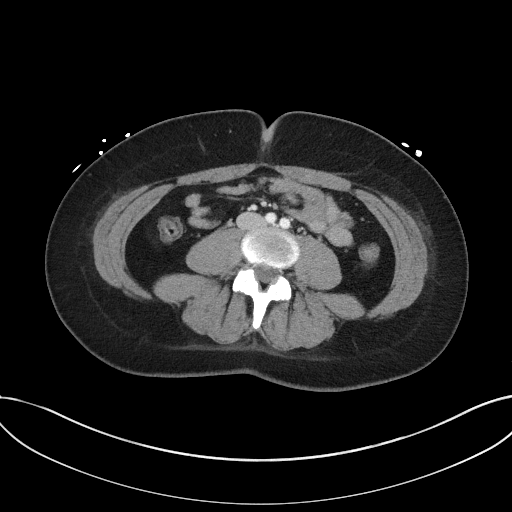
[im 51/91  soft-tissue]
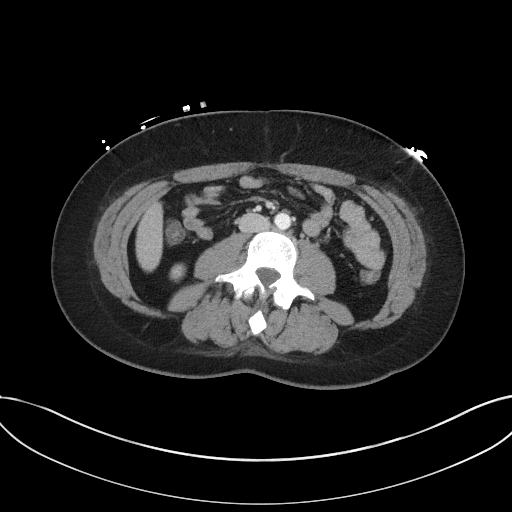
[im 58/91  soft-tissue]
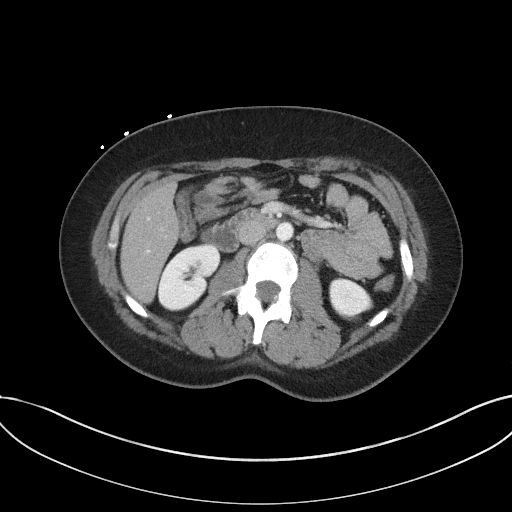
[im 58/91  bone]
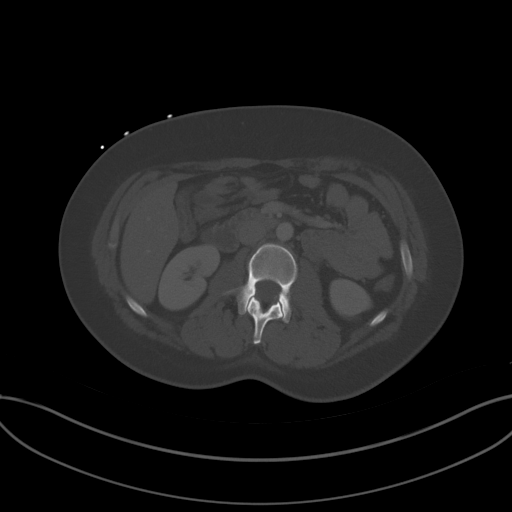
[im 65/91  soft-tissue]
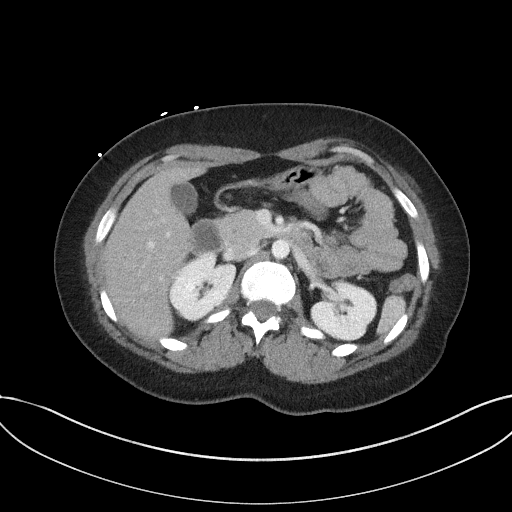
[im 73/91  soft-tissue]
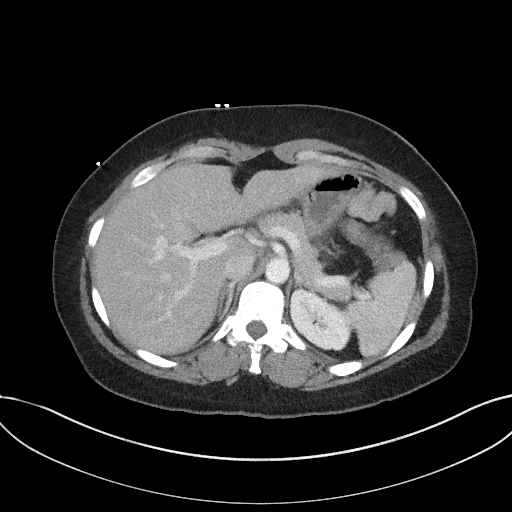
[im 80/91  soft-tissue]
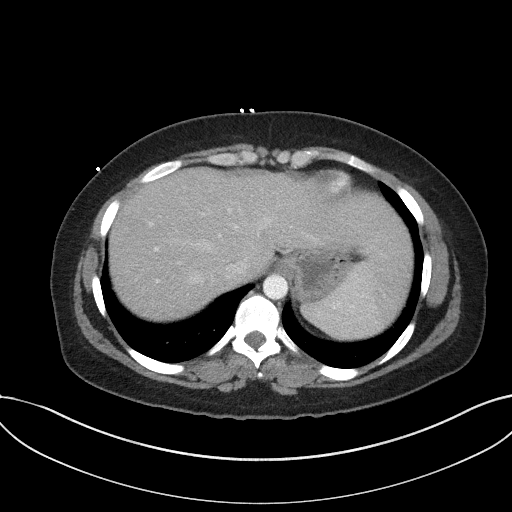
[im 87/91  soft-tissue]
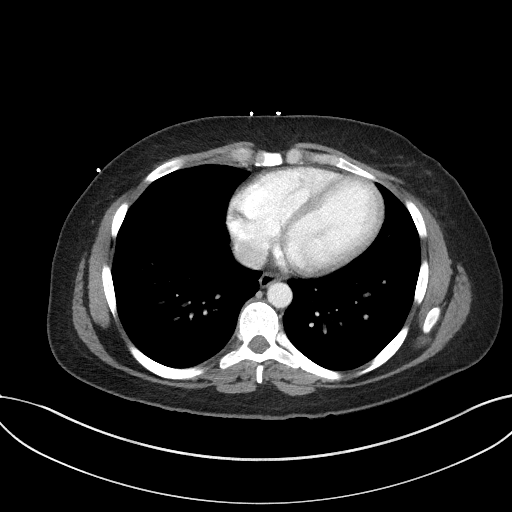

[Series 5: coronal st · coronal · 0.74mm/px · 3 of 73 slices shown]
[im 25/73  soft-tissue]
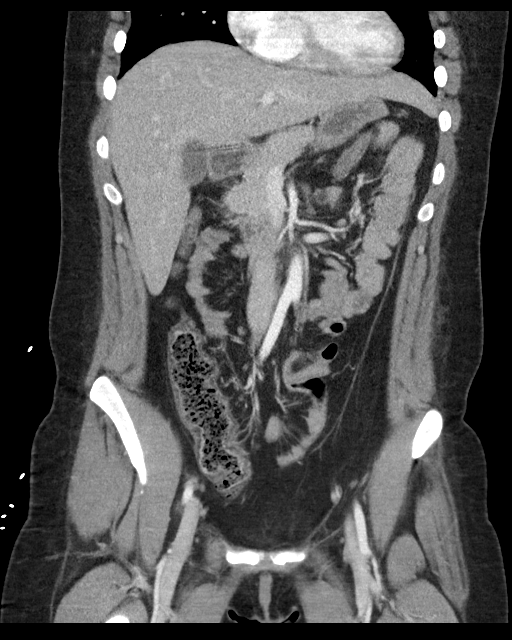
[im 33/73  soft-tissue]
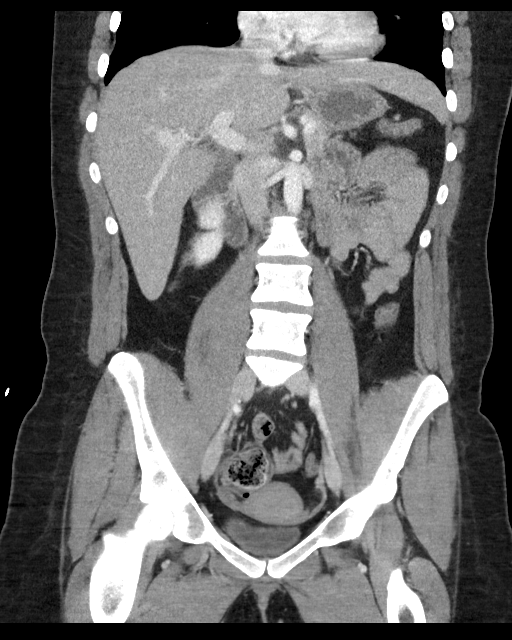
[im 41/73  soft-tissue]
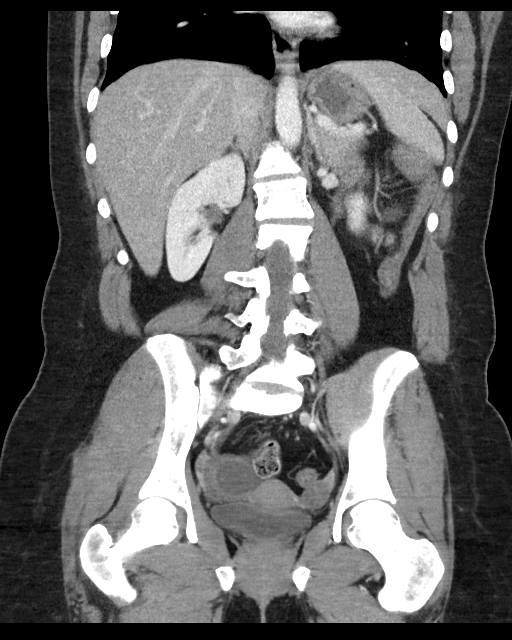

[16 of 46 positions shown; findings below may reference images not displayed]

FINDINGS: Lower chest: Lung bases are unremarkable.  Small hiatal hernia.

Hepatobiliary: Mild fatty infiltration of the liver. No focal
hepatic mass. No calcified gallstones are noted within gallbladder.

Pancreas: Enhanced pancreas is unremarkable.

Spleen: Enhanced spleen is unremarkable.

Adrenals/Urinary Tract: No adrenal gland mass. Enhanced kidneys are
symmetrical in size. No hydronephrosis or hydroureter. The urinary
bladder is empty limiting its assessment.

Stomach/Bowel: Study is limited without oral contrast material. No
small bowel obstruction. No thickened or dilated small bowel loops.
There is a low lying cecum. Moderate stool noted within cecum and
right colon. No pericecal inflammation. Normal appendix partially
visualized in coronal image 41. No evidence of acute appendicitis.
Nonspecific mild thickening of right colonic wall. Minimal colitis
cannot be excluded.

Descending colon transverse colon and rectosigmoid colon is empty
collapsed. No distal colonic obstruction. No acute diverticulitis.

Vascular/Lymphatic: No retroperitoneal or mesenteric adenopathy. No
aortic aneurysm.

Reproductive: There is anteflexed uterus. A hemorrhagic cyst is
noted in right ovary measures 3.5 cm. No pelvic free fluid is noted.
The left ovary is unremarkable.

Other: No ascites or free abdominal air.

Musculoskeletal: No destructive bony lesions are noted. Sagittal
images of the spine shows minimal degenerative changes lower
thoracic spine.
IMPRESSION: 1. Mild fatty infiltration of the liver.
2. No hydronephrosis or hydroureter.
3. Nonspecific minimal thickening of right colonic wall. Minimal
colitis cannot be excluded. There is a low lying cecum. No pericecal
inflammation. Normal appendix partially visualized.
4. Hemorrhagic cyst within right ovary measures 3.5 cm. No pelvic
free fluid is noted. Unremarkable uterus and left ovary.

## 2020-11-18 ENCOUNTER — Emergency Department
Admission: EM | Admit: 2020-11-18 | Discharge: 2020-11-18 | Disposition: A | Discharge status: Home / Self Care | Payer: Medicaid Other | Attending: Emergency Medicine | Admitting: Emergency Medicine

## 2020-11-18 ENCOUNTER — Other Ambulatory Visit: Payer: Self-pay

## 2020-11-18 DIAGNOSIS — R1013 Epigastric pain: Secondary | ICD-10-CM | POA: Insufficient documentation

## 2020-11-18 DIAGNOSIS — R112 Nausea with vomiting, unspecified: Secondary | ICD-10-CM | POA: Diagnosis not present

## 2020-11-18 DIAGNOSIS — N9489 Other specified conditions associated with female genital organs and menstrual cycle: Secondary | ICD-10-CM | POA: Insufficient documentation

## 2020-11-18 DIAGNOSIS — F17210 Nicotine dependence, cigarettes, uncomplicated: Secondary | ICD-10-CM | POA: Insufficient documentation

## 2020-11-18 DIAGNOSIS — R5381 Other malaise: Secondary | ICD-10-CM | POA: Insufficient documentation

## 2020-11-18 DIAGNOSIS — J45909 Unspecified asthma, uncomplicated: Secondary | ICD-10-CM | POA: Diagnosis not present

## 2020-11-18 DIAGNOSIS — R110 Nausea: Secondary | ICD-10-CM

## 2020-11-18 DIAGNOSIS — Z20822 Contact with and (suspected) exposure to covid-19: Secondary | ICD-10-CM | POA: Insufficient documentation

## 2020-11-18 DIAGNOSIS — L550 Sunburn of first degree: Secondary | ICD-10-CM | POA: Insufficient documentation

## 2020-11-18 LAB — COMPREHENSIVE METABOLIC PANEL
ALT: 29 U/L (ref 0–44)
AST: 29 U/L (ref 15–41)
Albumin: 4.5 g/dL (ref 3.5–5.0)
Alkaline Phosphatase: 49 U/L (ref 38–126)
Anion gap: 13 (ref 5–15)
BUN: 13 mg/dL (ref 6–20)
CO2: 24 mmol/L (ref 22–32)
Calcium: 9.7 mg/dL (ref 8.9–10.3)
Chloride: 99 mmol/L (ref 98–111)
Creatinine, Ser: 0.82 mg/dL (ref 0.44–1.00)
GFR, Estimated: >60 mL/min (ref >60)
Glucose, Bld: 110 mg/dL — ABNORMAL HIGH (ref 70–99)
Potassium: 3.8 mmol/L (ref 3.5–5.1)
Sodium: 136 mmol/L (ref 135–145)
Total Bilirubin: 1.1 mg/dL (ref 0.3–1.2)
Total Protein: 8.5 g/dL — ABNORMAL HIGH (ref 6.5–8.1)

## 2020-11-18 LAB — URINALYSIS, COMPLETE (UACMP) WITH MICROSCOPIC
Bilirubin Urine: NEGATIVE
Glucose, UA: NEGATIVE mg/dL
Hgb urine dipstick: NEGATIVE
Ketones, ur: 80 mg/dL — AB
Leukocytes,Ua: NEGATIVE
Nitrite: NEGATIVE
Protein, ur: 100 mg/dL — AB
Specific Gravity, Urine: 1.03 (ref 1.005–1.030)
pH: 5 (ref 5.0–8.0)

## 2020-11-18 LAB — RESP PANEL BY RT-PCR (FLU A&B, COVID) ARPGX2
Influenza A by PCR: NEGATIVE
Influenza B by PCR: NEGATIVE
SARS Coronavirus 2 by RT PCR: NEGATIVE

## 2020-11-18 LAB — HCG, QUANTITATIVE, PREGNANCY: hCG, Beta Chain, Quant, S: <1 m[IU]/mL (ref <5)

## 2020-11-18 LAB — CBC
HCT: 44.5 % (ref 36.0–46.0)
Hemoglobin: 15.1 g/dL — ABNORMAL HIGH (ref 12.0–15.0)
MCH: 30 pg (ref 26.0–34.0)
MCHC: 33.9 g/dL (ref 30.0–36.0)
MCV: 88.5 fL (ref 80.0–100.0)
Platelets: 316 10*3/uL (ref 150–400)
RBC: 5.03 MIL/uL (ref 3.87–5.11)
RDW: 12.6 % (ref 11.5–15.5)
WBC: 15.7 10*3/uL — ABNORMAL HIGH (ref 4.0–10.5)
nRBC: 0 % (ref 0.0–0.2)

## 2020-11-18 LAB — LIPASE, BLOOD: Lipase: 26 U/L (ref 11–51)

## 2020-11-18 MED ORDER — ONDANSETRON 4 MG PO TBDP: 4.0000 mg | ORAL_TABLET | Freq: Three times a day (TID) | ORAL | 0 refills | Status: AC | PRN

## 2020-11-18 MED ORDER — SODIUM CHLORIDE 0.9 % IV BOLUS
1000.0000 mL | Freq: Once | INTRAVENOUS | Status: AC
  Administered 2020-11-18: 1000 mL via INTRAVENOUS

## 2020-11-18 MED ORDER — CAPSAICIN 0.075 % EX CREA
TOPICAL_CREAM | Freq: Once | CUTANEOUS | Status: DC
  Filled 2020-11-18: qty 60

## 2020-11-18 MED ORDER — ONDANSETRON HCL 4 MG/2ML IJ SOLN
4.0000 mg | Freq: Once | INTRAMUSCULAR | Status: AC
Start: 2020-11-18 — End: 2020-11-18
  Administered 2020-11-18: 4 mg via INTRAVENOUS
  Filled 2020-11-18: qty 2

## 2020-11-18 NOTE — Discharge Instructions (Addendum)
You can take Zofran up to every eight hours as needed for nausea.  

## 2020-11-18 NOTE — ED Provider Notes (Signed)
[facility]-EMERGENCY DEPARTMENT  ____________________________________________  Time seen: Approximately 4:08 PM  I have reviewed the triage vital signs and the nursing notes.   HISTORY  Chief Complaint Emesis   Historian Patient     HPI Brittney Roberts is a 30 y.o. female presents to the emergency department with epigastric abdominal discomfort after vomiting and nausea as well as some generalized malaise.  Patient reports that she was out in the sun 2 days ago and had a sunburn to her face and back.  Patient denies constant pain, flank pain, dysuria, hematuria or increased urinary frequency.  She states that she has experienced this abdominal discomfort in the past and has received fluids and antiemetics.  She denies fever.  No associated rhinorrhea, nasal congestion or nonproductive cough.  Patient denies chest pain, chest tightness and shortness of breath.  Patient is concern for possible pregnancy.   Past Medical History:  Diagnosis Date   Asthma    Chlamydia 05/2012   [facility]   Herpes genitalia    Pelvic inflammatory disease 09/2012   [facility]   Polycystic ovaries      Immunizations up to date:  Yes.     Past Medical History:  Diagnosis Date   Asthma    Chlamydia 05/2012   [facility]   Herpes genitalia    Pelvic inflammatory disease 09/2012   [facility]   Polycystic ovaries     Patient Active Problem List   Diagnosis Date Noted   Marijuana abuse 06/22/2017    Past Surgical History:  Procedure Laterality Date   CESAREAN SECTION N/A 11/22/2017   Procedure: CESAREAN SECTION;  Surgeon: Natale Milch, MD;  Location: [facility] ORS;  Service: Obstetrics;  Laterality: N/A;   HAND SURGERY Left     Prior to Admission medications   Medication Sig Start Date End Date Taking? Authorizing Provider  ondansetron (ZOFRAN ODT) 4 MG disintegrating tablet Take 1 tablet (4 mg total) by mouth every 8 (eight) hours as needed for up to 5 days. 11/18/20 11/23/20 Yes  Pia Mau M, PA-C  acetaminophen (TYLENOL) 325 MG tablet Take 2 tablets (650 mg total) by mouth every 6 (six) hours as needed for mild pain (for pain scale < 4). 11/25/17   Schuman, Jaquelyn Bitter, MD  acyclovir (ZOVIRAX) 400 MG tablet Take 1 tablet (400 mg total) by mouth 3 (three) times daily. Patient not taking: Reported on 01/08/2018 11/16/17   Tresea Mall, CNM  escitalopram (LEXAPRO) 10 MG tablet Take 1 tablet (10 mg total) by mouth daily. 01/08/18   Schuman, Jaquelyn Bitter, MD  Prenatal Vit-Fe Fumarate-FA (MULTIVITAMIN-PRENATAL) 27-0.8 MG TABS tablet Take 1 tablet by mouth daily at 12 noon.    [provider]  zolpidem (AMBIEN) 5 MG tablet Take 1 tablet (5 mg total) by mouth at bedtime as needed for sleep. Patient not taking: Reported on 01/08/2018 10/12/17   Natale Milch, MD    Allergies Patient has no known allergies.  Family History  Problem Relation Age of Onset   Migraines Mother    Alzheimer's disease Maternal Grandmother    Arthritis Maternal Grandmother    Diabetes Maternal Grandmother    Heart disease Maternal Grandmother    Hyperlipidemia Maternal Grandmother    Hypertension Maternal Grandmother    Arthritis Maternal Grandfather    Bipolar disorder Maternal Grandfather    Heart disease Maternal Grandfather        MYOCARD. INFARCT-OLD/HEALED   Hyperlipidemia Maternal Grandfather    Hypertension Maternal Grandfather  Cancer Paternal Aunt        BREAST    Social History Social History   Tobacco Use   Smoking status: Every Day    Packs/day: 0.25    Years: 5.00    Pack years: 1.25    Types: Cigarettes   Smokeless tobacco: Never   Tobacco comments:    1/3 PPD  Vaping Use   Vaping Use: Every day  Substance Use Topics   Alcohol use: Yes    Comment: 1-4/WK; last drink 02/18/17   Drug use: Yes    Types: Marijuana    Comment: occ     Review of Systems  Constitutional: No fever/chills Eyes:  No discharge ENT: No upper respiratory  complaints. Respiratory: no cough. No SOB/ use of accessory muscles to breath Gastrointestinal: Patient has nausea and vomiting.  Musculoskeletal: Negative for musculoskeletal pain. Skin: Negative for rash, abrasions, lacerations, ecchymosis.    ____________________________________________   PHYSICAL EXAM:  VITAL SIGNS: ED Triage Vitals  Enc Vitals Group     BP 11/18/20 1142 (!) 157/96     Pulse Rate 11/18/20 1142 (!) 58     Resp 11/18/20 1142 18     Temp 11/18/20 1142 98.5 F (36.9 C)     Temp Source 11/18/20 1142 Oral     SpO2 11/18/20 1142 100 %     Weight 11/18/20 1157 175 lb (79.4 kg)     Height 11/18/20 1157 5\' 2"  (1.575 m)     Head Circumference --      Peak Flow --      Pain Score 11/18/20 1157 10     Pain Loc --      Pain Edu? --      Excl. in [place]? --      Constitutional: Alert and oriented. Well appearing and in no acute distress. Eyes: Conjunctivae are normal. PERRL. EOMI. Head: Atraumatic. ENT: Cardiovascular: Normal rate, regular rhythm. Normal S1 and S2.  Good peripheral circulation. Respiratory: Normal respiratory effort without tachypnea or retractions. Lungs CTAB. Good air entry to the bases with no decreased or absent breath sounds Gastrointestinal: Bowel sounds x 4 quadrants. Soft and nontender to palpation. No guarding or rigidity. No distention. Musculoskeletal: Full range of motion to all extremities. No obvious deformities noted Neurologic:  Normal for age. No gross focal neurologic deficits are appreciated.  Skin: Patient has first-degree sunburn of face and back. Psychiatric: Mood and affect are normal for age. Speech and behavior are normal.   ____________________________________________   LABS (all labs ordered are listed, but only abnormal results are displayed)  Labs Reviewed  COMPREHENSIVE METABOLIC PANEL - Abnormal; Notable for the following components:      Result Value   Glucose, Bld 110 (*)    Total Protein 8.5 (*)    All other  components within normal limits  CBC - Abnormal; Notable for the following components:   WBC 15.7 (*)    Hemoglobin 15.1 (*)    All other components within normal limits  URINALYSIS, COMPLETE (UACMP) WITH MICROSCOPIC - Abnormal; Notable for the following components:   Color, Urine AMBER (*)    APPearance CLOUDY (*)    Ketones, ur 80 (*)    Protein, ur 100 (*)    Bacteria, UA RARE (*)    All other components within normal limits  RESP PANEL BY RT-PCR (FLU A&B, COVID) ARPGX2  LIPASE, BLOOD  HCG, QUANTITATIVE, PREGNANCY  POC URINE PREG, ED   ____________________________________________  EKG   ____________________________________________  RADIOLOGY   No results found.  ____________________________________________    PROCEDURES  Procedure(s) performed:     Procedures     Medications  sodium chloride 0.9 % bolus 1,000 mL (0 mLs Intravenous Stopped 11/18/20 1815)  ondansetron (ZOFRAN) injection 4 mg (4 mg Intravenous Given 11/18/20 1638)     ____________________________________________   INITIAL IMPRESSION / ASSESSMENT AND PLAN / ED COURSE  Pertinent labs & imaging results that were available during my care of the patient were reviewed by me and considered in my medical decision making (see chart for details).      Assessment and plan Sunburn Nausea Vomiting Epigastric abdominal pain 30 year old female presents to the emergency department with nausea, vomiting and epigastric abdominal pain for the past 2 days in association with sunburn.  Patient was mildly hypertensive at triage but vital signs were otherwise reassuring.  Patient did have a first-degree sunburn along her upper back and face.  Her abdomen was soft and nontender without guarding.  Patient's CBC did indicate an elevated white blood cell count of 15.7 her lipase was within reference range and her CMP was reassuring.  Her beta-hCG was less than 1.  We will obtain COVID-19 testing and urinalysis  and will reassess after supplemental fluids and Zofran have been given.  We will also apply capsaicin to cover patient for cannabinoid hyperemesis syndrome.  Urinalysis shows proteinuria and ketonuria but no findings consistent with UTI.  Patient felt significantly improved after supplemental fluids and Zofran.  Will discharge patient home with Zofran with strict return precautions to return with new or worsening symptoms.  All patient questions were answered.     ____________________________________________  FINAL CLINICAL IMPRESSION(S) / ED DIAGNOSES  Final diagnoses:  Nausea  Non-intractable vomiting with nausea, unspecified vomiting type      NEW MEDICATIONS STARTED DURING THIS VISIT:  ED Discharge Orders          Ordered    ondansetron (ZOFRAN ODT) 4 MG disintegrating tablet  Every 8 hours PRN        11/18/20 1818                This chart was dictated using voice recognition software/Dragon. Despite best efforts to proofread, errors can occur which can change the meaning. Any change was purely unintentional.     Orvil Feil, PA-C 11/18/20 2014    Shaune Pollack, MD 11/18/20 2242

## 2020-11-18 NOTE — ED Notes (Signed)
First Nurse Note: Pt to ED via POV for N/V.

## 2020-11-18 NOTE — ED Triage Notes (Signed)
Pt to ER via POV with complaints of nausea x1 week. Reports bad sunburn then waking up with nausea and has been throwing up since, last episode approx 1 hour ago. Also reports severe generalized abdominal pain. Denies urinary symptoms or diarrhea. Reports taking a pregnancy test and it was negative, last period May 29- June 1st . Reports hx of irregular periods but she doesn't usually go this long between them.

## 2022-05-30 ENCOUNTER — Ambulatory Visit (LOCAL_COMMUNITY_HEALTH_CENTER): Payer: Medicaid Other

## 2022-05-30 VITALS — BP 121/81 | Ht 62.5 in | Wt 165.0 lb

## 2022-05-30 DIAGNOSIS — Z3201 Encounter for pregnancy test, result positive: Secondary | ICD-10-CM | POA: Diagnosis not present

## 2022-05-30 LAB — PREGNANCY, URINE: Preg Test, Ur: POSITIVE — AB

## 2022-05-30 MED ORDER — PRENATAL 27-0.8 MG PO TABS: 1.0000 | ORAL_TABLET | Freq: Every day | ORAL | 0 refills | Status: AC

## 2022-05-30 NOTE — Progress Notes (Signed)
UPT positive.  Considering [facility] for prenatal care. Local prenatal provider list given.  The patient was dispensed prenatal vitamins #100 today per [facility] Dr Vertell Novak. I provided counseling today regarding the medication. We discussed the medication, the side effects and when to call clinic. Patient given the opportunity to ask questions. Questions answered.     Sent to DSS for medicaid/preg women. Josie Saunders, RN

## 2022-06-05 ENCOUNTER — Emergency Department: Payer: Medicaid Other

## 2022-06-05 ENCOUNTER — Emergency Department
Admission: EM | Admit: 2022-06-05 | Discharge: 2022-06-05 | Disposition: A | Discharge status: Home / Self Care | Payer: Medicaid Other | Attending: Emergency Medicine | Admitting: Emergency Medicine

## 2022-06-05 ENCOUNTER — Other Ambulatory Visit: Payer: Self-pay

## 2022-06-05 DIAGNOSIS — Z3A08 8 weeks gestation of pregnancy: Secondary | ICD-10-CM | POA: Insufficient documentation

## 2022-06-05 DIAGNOSIS — O219 Vomiting of pregnancy, unspecified: Secondary | ICD-10-CM | POA: Diagnosis present

## 2022-06-05 DIAGNOSIS — J45909 Unspecified asthma, uncomplicated: Secondary | ICD-10-CM | POA: Diagnosis not present

## 2022-06-05 DIAGNOSIS — Z20822 Contact with and (suspected) exposure to covid-19: Secondary | ICD-10-CM | POA: Insufficient documentation

## 2022-06-05 DIAGNOSIS — O418X10 Other specified disorders of amniotic fluid and membranes, first trimester, not applicable or unspecified: Secondary | ICD-10-CM

## 2022-06-05 DIAGNOSIS — O4691 Antepartum hemorrhage, unspecified, first trimester: Secondary | ICD-10-CM | POA: Diagnosis not present

## 2022-06-05 LAB — URINALYSIS, ROUTINE W REFLEX MICROSCOPIC
Bacteria, UA: NONE SEEN
Bilirubin Urine: NEGATIVE
Glucose, UA: 50 mg/dL — AB
Hgb urine dipstick: NEGATIVE
Ketones, ur: 80 mg/dL — AB
Leukocytes,Ua: NEGATIVE
Nitrite: NEGATIVE
Protein, ur: 100 mg/dL — AB
Specific Gravity, Urine: 1.029 (ref 1.005–1.030)
pH: 5 (ref 5.0–8.0)

## 2022-06-05 LAB — CBC
HCT: 44.8 % (ref 36.0–46.0)
Hemoglobin: 14.9 g/dL (ref 12.0–15.0)
MCH: 29.9 pg (ref 26.0–34.0)
MCHC: 33.3 g/dL (ref 30.0–36.0)
MCV: 89.8 fL (ref 80.0–100.0)
Platelets: 272 10*3/uL (ref 150–400)
RBC: 4.99 MIL/uL (ref 3.87–5.11)
RDW: 11.9 % (ref 11.5–15.5)
WBC: 16.4 10*3/uL — ABNORMAL HIGH (ref 4.0–10.5)
nRBC: 0 % (ref 0.0–0.2)

## 2022-06-05 LAB — COMPREHENSIVE METABOLIC PANEL
ALT: 18 U/L (ref 0–44)
AST: 29 U/L (ref 15–41)
Albumin: 4.1 g/dL (ref 3.5–5.0)
Alkaline Phosphatase: 47 U/L (ref 38–126)
Anion gap: 15 (ref 5–15)
BUN: 9 mg/dL (ref 6–20)
CO2: 19 mmol/L — ABNORMAL LOW (ref 22–32)
Calcium: 9.2 mg/dL (ref 8.9–10.3)
Chloride: 103 mmol/L (ref 98–111)
Creatinine, Ser: 0.58 mg/dL (ref 0.44–1.00)
GFR, Estimated: >60 mL/min (ref >60)
Glucose, Bld: 153 mg/dL — ABNORMAL HIGH (ref 70–99)
Potassium: 3.9 mmol/L (ref 3.5–5.1)
Sodium: 137 mmol/L (ref 135–145)
Total Bilirubin: 0.8 mg/dL (ref 0.3–1.2)
Total Protein: 7.9 g/dL (ref 6.5–8.1)

## 2022-06-05 LAB — RESP PANEL BY RT-PCR (RSV, FLU A&B, COVID)  RVPGX2
Influenza A by PCR: NEGATIVE
Influenza B by PCR: NEGATIVE
Resp Syncytial Virus by PCR: NEGATIVE
SARS Coronavirus 2 by RT PCR: NEGATIVE

## 2022-06-05 LAB — LIPASE, BLOOD: Lipase: 26 U/L (ref 11–51)

## 2022-06-05 LAB — HCG, QUANTITATIVE, PREGNANCY: hCG, Beta Chain, Quant, S: 183422 m[IU]/mL — ABNORMAL HIGH (ref <5)

## 2022-06-05 MED ORDER — LACTATED RINGERS IV BOLUS
1000.0000 mL | Freq: Once | INTRAVENOUS | Status: AC
  Administered 2022-06-05: 1000 mL via INTRAVENOUS

## 2022-06-05 MED ORDER — DOXYLAMINE-PYRIDOXINE 10-10 MG PO TBEC: 2.0000 | DELAYED_RELEASE_TABLET | Freq: Every day | ORAL | 0 refills | Status: DC

## 2022-06-05 MED ORDER — METOCLOPRAMIDE HCL 10 MG PO TABS: 10.0000 mg | ORAL_TABLET | Freq: Three times a day (TID) | ORAL | 0 refills | Status: DC | PRN

## 2022-06-05 MED ORDER — METOCLOPRAMIDE HCL 5 MG/ML IJ SOLN
10.0000 mg | Freq: Once | INTRAMUSCULAR | Status: AC
  Administered 2022-06-05: 10 mg via INTRAVENOUS
  Filled 2022-06-05: qty 2

## 2022-06-05 NOTE — ED Triage Notes (Signed)
Pt reports his son is sick and now she is having n/v. Pt is 2 months pregnant and now having some generalized abd cramping.

## 2022-06-05 NOTE — Discharge Instructions (Addendum)
-  For your nausea/vomiting, you may take the doxylamine/pyridoxine as prescribed.  If needed, you may additionally take metoclopramide.  -Your ultrasound did show that you do have a small subchorionic hematoma.  Please follow-up with your OB/GYN in regards to continued monitoring of this hematoma to ensure that it is not enlarging.  -Return to the emergency department anytime if you begin to experience any new or worsening symptoms.

## 2022-06-05 NOTE — ED Notes (Signed)
Report given to [place] RN

## 2022-06-05 NOTE — ED Provider Notes (Signed)
[facility] Provider Note    Event Date/Time   First MD Initiated Contact with Patient 06/05/22 1617     (approximate)   History   Chief Complaint Emesis   HPI Brittney Roberts is a 32 y.o. female, history of asthma, depression, marijuana use, presents to the emergency department for evaluation of emesis.  She states that she has vomited multiple times since earlier this morning, associated with intermittent episodes of diarrhea as well.  Reports some intermittent generalized abdominal pain as well.  She states that she is pregnant, approximately 2 months, though she has not had an appoint with OB/GYN yet or had any ultrasounds at this time.  Denies any vaginal bleeding or recent trauma.  Denies fever/chills, chest pain, shortness of breath, hematemesis, hematochezia, urinary symptoms, headache, or dizziness/lightheadedness.  History Limitations: No limitations.        Physical Exam  Triage Vital Signs: ED Triage Vitals [06/05/22 1429]  Enc Vitals Group     BP (!) 167/73     Pulse Rate (!) 51     Resp 18     Temp 97.7 F (36.5 C)     Temp src      SpO2 99 %     Weight 163 lb 2.3 oz (74 kg)     Height 5' 2"$  (1.575 m)     Head Circumference      Peak Flow      Pain Score 8     Pain Loc      Pain Edu?      Excl. in [place]?     Most recent vital signs: Vitals:   06/05/22 1431 06/05/22 2016  BP: 138/78 104/62  Pulse:  66  Resp:  17  Temp:  99 F (37.2 C)  SpO2:  98%    General: Awake, NAD.  Skin: Warm, dry. No rashes or lesions.  Eyes: PERRL. Conjunctivae normal.  CV: Good peripheral perfusion.  Resp: Normal effort.  Abd: Soft, non-tender. No distention.  Neuro: At baseline. No gross neurological deficits.  Musculoskeletal: Normal ROM of all extremities.   Physical Exam    ED Results / Procedures / Treatments  Labs (all labs ordered are listed, but only abnormal results are displayed) Labs Reviewed  COMPREHENSIVE METABOLIC PANEL  - Abnormal; Notable for the following components:      Result Value   CO2 19 (*)    Glucose, Bld 153 (*)    All other components within normal limits  CBC - Abnormal; Notable for the following components:   WBC 16.4 (*)    All other components within normal limits  URINALYSIS, ROUTINE W REFLEX MICROSCOPIC - Abnormal; Notable for the following components:   Color, Urine YELLOW (*)    APPearance HAZY (*)    Glucose, UA 50 (*)    Ketones, ur 80 (*)    Protein, ur 100 (*)    All other components within normal limits  HCG, QUANTITATIVE, PREGNANCY - Abnormal; Notable for the following components:   hCG, Beta Chain, Quant, S [id number] (*)    All other components within normal limits  RESP PANEL BY RT-PCR (RSV, FLU A&B, COVID)  RVPGX2  LIPASE, BLOOD     EKG N/A.    RADIOLOGY  ED Provider Interpretation: I personally viewed and interpreted this ultrasound, small subchorionic hemorrhage present.  US OB Comp Less 14 Wks  Result Date: 06/05/2022 CLINICAL DATA:  32 year old pregnant female presenting with nausea, vomiting and abdominal pain. EXAM:  OBSTETRIC <14 WK ULTRASOUND TECHNIQUE: Transabdominal ultrasound was performed for evaluation of the gestation as well as the maternal uterus and adnexal regions. COMPARISON:  Remote OB ultrasound 07/27/2017. FINDINGS: Intrauterine gestational sac: Single Yolk sac:  Present Embryo:  Present Cardiac Activity: Present Heart Rate: 178 bpm CRL:   19.5 mm   8 w 4 d                  [name] EDC: 01/11/2023 Subchorionic hemorrhage: Small heterogeneously hypoechoic collection adjacent to the gestational sac measuring approximately 1.3 x 0.7 cm, compatible with a small subchorionic hemorrhage. Maternal uterus/adnexae: Uterus and ovaries are otherwise unremarkable in appearance. No free fluid in the cul-de-sac. IMPRESSION: 1. Single viable IUP with estimated gestational age of [redacted] weeks and 4 days and fetal heart rate of 170 beats per minute. 2. Small subchorionic  hemorrhage, as above. Electronically Signed   By: Vinnie Langton M.D.   On: 06/05/2022 19:11    PROCEDURES:  Critical Care performed: N/A.  Procedures    MEDICATIONS ORDERED IN ED: Medications  lactated ringers bolus 1,000 mL (0 mLs Intravenous Stopped 06/05/22 2016)  metoCLOPramide (REGLAN) injection 10 mg (10 mg Intravenous Given 06/05/22 1710)     IMPRESSION / MDM / ASSESSMENT AND PLAN / ED COURSE  I reviewed the triage vital signs and the nursing notes.                              Differential diagnosis includes, but is not limited to, nausea vomiting in pregnancy, hyperemesis gravidarum, gastroenteritis, cannabinoid hyperemesis, colitis.  ED Course CBC shows leukocytosis at 16.4.  No anemia or thrombocytopenia present.  CMP shows no electrolyte abnormalities, AKI, or transaminitis.  Respiratory panel negative for COVID-19, influenza, or RSV.  Beta-hCG elevated at 183,422, consistent with her gestational period.  Urinalysis shows ketonuria proteinuria, consistent with dehydration, otherwise no evidence of infection.  Assessment/Plan Patient presents with multiple episodes of emesis x 1 day in the setting of early pregnancy.  Associated with mild abdominal pain.  She appears well clinically.  Vitals within normal limits.  Physical exam is overall unremarkable.  No signs of any acute abdominal pathology.  Her ultrasound shows single viable intrauterine pregnancy with estimated gestational age of [redacted] weeks and 4 days with normal fetal heart rate.  There was a small subchorionic hemorrhage noted.  I notified the patient of this finding and advised her that she will need to follow-up with OB/GYN for reevaluation.  She has a appointment scheduled within the next week.  Her lab workup is overall reassuring.  She responded well to the IV fluids and metoclopramide.  Will provide her with a prescription for doxylamine/pyridoxine, as well as metoclopramide p.o. to be taken as needed.  She  is amenable to this.  Will discharge.  Considered admission for this patient, but given her stable presentation and unremarkable workup, she is UNLIKELY to benefit from admission.  Provided the patient with anticipatory guidance, return precautions, and educational material. Encouraged the patient to return to the emergency department at any time if they begin to experience any new or worsening symptoms. Patient expressed understanding and agreed with the plan.   Patient's presentation is most consistent with acute complicated illness / injury requiring diagnostic workup.       FINAL CLINICAL IMPRESSION(S) / ED DIAGNOSES   Final diagnoses:  Subchorionic hemorrhage of placenta in first trimester, single or unspecified fetus  Nausea/vomiting in pregnancy  Rx / DC Orders   ED Discharge Orders          Ordered    Doxylamine-Pyridoxine 10-10 MG TBEC  Daily at bedtime        06/05/22 2006    metoCLOPramide (REGLAN) 10 MG tablet  Every 8 hours PRN        06/05/22 2006             Note:  This document was prepared using Dragon voice recognition software and may include unintentional dictation errors.   Teodoro Spray, [place] 06/05/22 2313    Arta Silence, MD 06/06/22 1459

## 2022-06-05 NOTE — ED Notes (Signed)
Patient taken to ultrasound.

## 2022-06-23 DIAGNOSIS — O0993 Supervision of high risk pregnancy, unspecified, third trimester: Secondary | ICD-10-CM | POA: Insufficient documentation

## 2022-06-28 DIAGNOSIS — Z98891 History of uterine scar from previous surgery: Secondary | ICD-10-CM | POA: Insufficient documentation

## 2022-12-08 ENCOUNTER — Encounter: Payer: Self-pay | Admitting: Dietician

## 2022-12-08 ENCOUNTER — Encounter: Payer: Medicaid Other | Attending: Obstetrics | Admitting: Dietician

## 2022-12-08 DIAGNOSIS — O24419 Gestational diabetes mellitus in pregnancy, unspecified control: Secondary | ICD-10-CM | POA: Diagnosis present

## 2022-12-08 DIAGNOSIS — Z713 Dietary counseling and surveillance: Secondary | ICD-10-CM | POA: Insufficient documentation

## 2022-12-08 DIAGNOSIS — Z3A35 35 weeks gestation of pregnancy: Secondary | ICD-10-CM | POA: Insufficient documentation

## 2022-12-08 NOTE — Progress Notes (Signed)
Patient was seen for Gestational Diabetes self-management on 12/08/2022  Start time 1040 and End time 1130   Estimated due date: 01/10/2023; [redacted]w[redacted]d  Clinical: Medications: Prenatal  Medical History: Asthma, Labs: OGTT 197 @1  hr, 160 @2  hr,     Dietary and Lifestyle History: Pt present for appointment with BG logs, FBG ~90-95, PPBG ~110 - 120 mg/dL, blood sugar is very well controlled! Pt reports this is their second pregnancy, had some lower leg swelling in first pregnancy, minor swelling this pregnancy, no GDM previously. 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 Pt reports they will still be active on leave at home. Pt reports a few bouts of hypoglycemia while at work (fatigue, headache) checks BG and it will be mid-70s, pt will drink apple juice to get sugar back up. Pt reports frequent reflux in the evenings during pregnancy, taking Pepcid Complete, sleeping with head elevated.   Physical Activity: Active at work, Stress: Moderate Sleep: Wakes up every few hours r/t reflux/needing to go to bathroom   24 hr Recall:  First Meal: Cottage cheese, strawberry jam Snack: Second meal: BEC bagel Snack:  Third meal: Rotisserie chicken, red/black beans, carrots, corn, water, cherry coke Snack: Chocolate fudgsicle Beverages:Water, occasional soda w/ dinner   NUTRITION INTERVENTION  Nutrition education (E-1) on the following topics:   Initial Follow-up  [x]  []  Definition of Gestational Diabetes [x]  []  Why dietary management is important in controlling blood glucose [x]  []  Effects each nutrient has on blood glucose levels [x]  []  Simple carbohydrates vs complex carbohydrates [x]  []  Fluid intake [x]  []  Creating a balanced meal plan [x]  []  Carbohydrate counting  [x]  []  When to check blood glucose levels [x]  []  Proper blood glucose monitoring techniques [x]  []  Effect of stress and stress reduction techniques   [x]  []  Exercise effect on blood glucose levels, appropriate exercise during pregnancy [x]  []  Importance of limiting caffeine and abstaining from alcohol and smoking [x]  []  Medications used for blood sugar control during pregnancy [x]  []  Hypoglycemia and rule of 15 [x]  []  Postpartum self care    Patient has a meter prior to visit. Patient is testing pre breakfast and 2 hours after each meal. FBS: 90 - 95 Postprandial: 100 - 110  Patient instructed to monitor glucose levels: FBS: 60 - ? 95 mg/dL; 2 hour: ? 119 mg/dL  Patient received handouts: Nutrition Diabetes and Pregnancy GERD Nutrition Therapy Blood Glucose Log  Patient will be seen for follow-up as needed.

## 2022-12-20 NOTE — H&P (Signed)
Brittney Roberts is a 32 y.o. female presenting for  here for elective repeat LTCS  40+1 weeks  .32 y.o. G2P1001 at  Patient's last menstrual period was 04/04/2022. 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  BID ordered Marijuana use in pregnancy  Desires to stop UDS at NOB positive for cannabinoids and nicotine  UDS on admission to L&D Vaping  Growth [name] 3rd trimester Weekly NST at 36 weeks  Screening results and needs: NOB:  Medicaid Questionnaire:  []  ACHD Program - declined services  Depression Score: 3 MBT: O pos  Ab screen: Negative  HIV: Neg    RPR:   NR Hep B: Neg Hep C: NR Pap: NILM, hrHPV neg G/C: neg/neg  Rubella: Imm    VZV: Imm TSH: 1.139 HgA1C: 5.2 Aneuploidy:  First trimester:  MaternitT21: Neg   Second trimester (AFP/tetra): Neg 28 weeks:  Review Medicaid Questionnaire: []  ACHD Program Depression Score: 0 Blood consent: Signed 10/13/22 Hgb: 11.1   Platelets: 202    Glucola: 161     3hr GCT: 76, 197, 160, 134   Rhogam: N/A 36 weeks:  GBS: Neg  G/C: Neg/Neg  Hgb: 11.1 Platelets: 200   HIV: Neg     RPR:  N/R  Last [name]:  06/05/2022: SIUP seen; FHR 178 bpm; CRL = 19.5 mm = [redacted]w[redacted]d; [name] EDC 01/11/2023; SCH seen 1.3 x 0.7 cm 06/23/2022: SIUP, CRL = 47.1 mm = [redacted]w[redacted]d; EDC by [name] 01/08/2023; FHR 162 bpm; cervical length 4.62 cm ; SCH seen 1.35 x 0.35 x 0.39 08/19/2022: Normal anatomy seen, SIUP seen, S=[redacted]w[redacted]d, FHR 149 bpm, cervical length = 5.56 cm; bilateral ovaries appear WNL, breech position  12/05/22: cephalic, post plac, [id number] g@78 %, AFI-19.9 cm Immunization:   Flu in season -  Tdap at 27-36 weeks - Given 10/13/2022 RSV at 32-36 weeks -  Contraception Plan:   Feeding Plan:  Labor Plans:      OB History     Gravida  2   Para  1   Term  1   Preterm  0   AB  0   Living  1      SAB  0   IAB  0   Ectopic  0   Multiple  0   Live Births  1          Past Medical History:  Diagnosis Date   Asthma    Chlamydia 05/2012   ACHD   Depression    Herpes genitalia    Pelvic inflammatory disease 09/2012   [facility]   Polycystic ovaries    Past Surgical History:  Procedure Laterality Date   CESAREAN SECTION N/A 11/22/2017   Procedure: CESAREAN SECTION;  Surgeon: Natale Milch, MD;  Location: [facility] ORS;  Service: Obstetrics;  Laterality: N/A;   HAND SURGERY Left    Family History: family history includes Alzheimer's disease in her maternal grandmother; Arthritis in her maternal grandfather and maternal grandmother; Bipolar disorder in her maternal grandfather; Cancer in her paternal aunt; Diabetes in her maternal grandmother; Heart disease in her maternal grandfather and maternal grandmother; Hyperlipidemia in her maternal grandfather and maternal grandmother; Hypertension in her maternal grandfather  and maternal grandmother; Migraines in her mother. Social History:  reports that she quit smoking about 2 years ago. Her smoking use included cigarettes. She started smoking about 7 years ago. She has a 1.3 pack-year smoking history. She has never used smokeless tobacco. She reports that she does not currently use alcohol. She reports that she does not currently use drugs after having used the following drugs: Marijuana.       Review of Systems History   Last menstrual period 04/04/2022. Exam 12/21/22  Physical Exam  Lungs CTA   CV RRR   Abd: gravid , NT  Prenatal labs: ABO, Rh:  O+ Antibody:  neg  Rubella:  Imm, VZ Imm  RPR:   NR  HBsAg:   neg , hep C , neg HIV:   Neg  GBS:   Neg   Assessment/Plan: Elective repeat LTCS 40+1 weeks  Risks discussed with pt - see [facility] notes  Prophylaxis with  Ancef   Brittney Roberts 12/20/2022, 2:06 PM

## 2023-01-02 ENCOUNTER — Encounter
Admission: RE | Admit: 2023-01-02 | Discharge: 2023-01-02 | Disposition: A | Discharge status: Home / Self Care | Payer: Medicaid Other | Source: Ambulatory Visit | Attending: Obstetrics and Gynecology | Admitting: Obstetrics and Gynecology

## 2023-01-02 DIAGNOSIS — Z01818 Encounter for other preprocedural examination: Secondary | ICD-10-CM

## 2023-01-02 HISTORY — DX: Anemia, unspecified: D64.9

## 2023-01-02 HISTORY — DX: Other problems related to lifestyle: Z72.89

## 2023-01-02 HISTORY — DX: Cannabis use, unspecified, uncomplicated: F12.90

## 2023-01-02 HISTORY — DX: Personal history of nicotine dependence: Z87.891

## 2023-01-02 HISTORY — DX: Gastro-esophageal reflux disease without esophagitis: K21.9

## 2023-01-02 HISTORY — DX: Gestational diabetes mellitus in pregnancy, unspecified control: O24.419

## 2023-01-02 NOTE — Patient Instructions (Addendum)
Your procedure is scheduled on:01-10-23 Tuesday  Arrival Time: 5:30 AM. Please call Labor and Delivery if you have any [phone].  Arrival: If your arrival time is prior to 6:00 am, please enter through the Emergency Room Entrance and you will be directed to Labor and Delivery. If your arrival time is 6:00 am or later, please enter the Medical Mall and follow the greeter's instructions.  REMEMBER: Instructions that are not followed completely may result in serious medical risk, up to and including death; or upon the discretion of your surgeon and anesthesiologist your surgery may need to be rescheduled.  Do not eat food OR drink any liquids after midnight the night before surgery.  No gum chewing or hard candies.  One week prior to surgery: Stop Anti-inflammatories (NSAIDS) such as Advil, Aleve, Ibuprofen, Motrin, Naproxen, Naprosyn and Aspirin based products such as Excedrin, Goody's Powder, BC Powder. You may however, continue to take Tylenol if needed for pain up until the day of surgery.  Continue taking all prescribed medications   TAKE ONLY THESE MEDICATIONS THE MORNING OF SURGERY WITH A SIP OF WATER: -valACYclovir (VALTREX)  -omeprazole (PRILOSEC OTC)-Take one the night before surgery and one the morning of surgery  No Alcohol for 24 hours before or after surgery.  No Smoking including e-cigarettes for 24 hours prior to surgery.  No chewable tobacco products for at least 6 hours prior to surgery.  No nicotine patches on the day of surgery.  Do not use any "recreational" drugs for at least a week prior to your surgery.  Please be advised that the combination of cocaine and anesthesia may have negative outcomes, up to and including death. If you test positive for cocaine, your surgery will be cancelled.  On the morning of surgery brush your teeth with toothpaste and water, you may rinse your mouth with mouthwash if you wish. Do not swallow any toothpaste or  mouthwash.  Use CHG soap as directed on instruction sheet.  Do not wear jewelry, make-up, hairpins, clips or nail polish.  For welded (permanent) jewelry: bracelets, anklets, waist bands, etc.  Please have this removed prior to surgery.  If it is not removed, there is a chance that hospital personnel will need to cut it off on the day of surgery.  Do not wear lotions, powders, or perfumes.   Do not shave body hair from the neck down 48 hours before surgery.  Contact lenses, hearing aids and dentures may not be worn into surgery.  Do not bring valuables to the hospital. [facility] is not responsible for any missing/lost belongings or valuables.   Notify your doctor if there is any change in your medical condition (cold, fever, infection).  Wear comfortable clothing (specific to your surgery type) to the hospital.  After surgery, you can help prevent lung complications by doing breathing exercises.  Take deep breaths and cough every 1-2 hours. Your doctor may order a device called an Incentive Spirometer to help you take deep breaths. When coughing or sneezing, hold a pillow firmly against your incision with both hands. This is called "splinting." Doing this helps protect your incision. It also decreases belly discomfort.  Please call the Pre-admissions Testing Dept. at [phone] if you have any questions about these instructions.  Surgery Visitation Policy:  Visitor Passes   All visitors, including children, need an identification sticker when visiting. These stickers must be worn where they can be seen.   Labor & Delivery  Laboring women may have one designated  support person and two other visitors of any age visit. The support person must remain the same. The visitors may switch with other visitors. Visitation is permitted 24 hours per day. The designated support person or a visitor over the age of 16 may sleep overnight in the patient's room. A doula registered with  [place] for labor and delivery support is not considered a visitor. Doulas not registered with [place] are considered visitors.  Mother Baby Unit, OB Specialty and Gynecological Care  A designated support person and three visitors of any age may visit. The three visitors may switch out. The designated support person or a visitor age 70 or older may stay overnight in the room. During the postpartum period (up to 6 weeks), if the mother is the patient, she can have her newborn stay with her if there is another support person present who can be responsible for the baby.       Preparing for Surgery with CHLORHEXIDINE GLUCONATE (CHG) Soap  Chlorhexidine Gluconate (CHG) Soap  o An antiseptic cleaner that kills germs and bonds with the skin to continue killing germs even after washing  o Used for showering the night before surgery and morning of surgery  Before surgery, you can play an important role by reducing the number of germs on your skin.  CHG (Chlorhexidine gluconate) soap is an antiseptic cleanser which kills germs and bonds with the skin to continue killing germs even after washing.  Please do not use if you have an allergy to CHG or antibacterial soaps. If your skin becomes reddened/irritated stop using the CHG.  1. Shower the NIGHT BEFORE SURGERY and the MORNING OF SURGERY with CHG soap.  2. If you choose to wash your hair, wash your hair first as usual with your normal shampoo.  3. After shampooing, rinse your hair and body thoroughly to remove the shampoo.  4. Use CHG as you would any other liquid soap. You can apply CHG directly to the skin and wash gently with a scrungie or a clean washcloth.  5. Apply the CHG soap to your body only from the neck down. Do not use on open wounds or open sores. Avoid contact with your eyes, ears, mouth, and genitals (private parts). Wash face and genitals (private parts) with your normal soap.  6. Wash thoroughly, paying special  attention to the area where your surgery will be performed.  7. Thoroughly rinse your body with warm water.  8. Do not shower/wash with your normal soap after using and rinsing off the CHG soap.  9. Pat yourself dry with a clean towel.  10. Wear clean pajamas to bed the night before surgery.  12. Place clean sheets on your bed the night of your first shower and do not sleep with pets.  13. Shower again with the CHG soap on the day of surgery prior to arriving at the hospital.  14. Do not apply any deodorants/lotions/powders.  15. Please wear clean clothes to the hospital.  How to Use an Incentive Spirometer An incentive spirometer is a tool that measures how well you are filling your lungs with each breath. Learning to take long, deep breaths using this tool can help you keep your lungs clear and active. This may help to reverse or lessen your chance of developing breathing (pulmonary) problems, especially infection. You may be asked to use a spirometer: After a surgery. If you have a lung problem or a history of smoking. After a long period of  time when you have been unable to move or be active. If the spirometer includes an indicator to show the highest number that you have reached, your health care provider or respiratory therapist will help you set a goal. Keep a log of your progress as told by your health care provider. What are the risks? Breathing too quickly may cause dizziness or cause you to pass out. Take your time so you do not get dizzy or light-headed. If you are in pain, you may need to take pain medicine before doing incentive spirometry. It is harder to take a deep breath if you are having pain. How to use your incentive spirometer  Sit up on the edge of your bed or on a chair. Hold the incentive spirometer so that it is in an upright position. Before you use the spirometer, breathe out normally. Place the mouthpiece in your mouth. Make sure your lips are closed  tightly around it. Breathe in slowly and as deeply as you can through your mouth, causing the piston or the ball to rise toward the top of the chamber. Hold your breath for 3-5 seconds, or for as long as possible. If the spirometer includes a coach indicator, use this to guide you in breathing. Slow down your breathing if the indicator goes above the marked areas. Remove the mouthpiece from your mouth and breathe out normally. The piston or ball will return to the bottom of the chamber. Rest for a few seconds, then repeat the steps 10 or more times. Take your time and take a few normal breaths between deep breaths so that you do not get dizzy or light-headed. Do this every 1-2 hours when you are awake. If the spirometer includes a goal marker to show the highest number you have reached (best effort), use this as a goal to work toward during each repetition. After each set of 10 deep breaths, cough a few times. This will help to make sure that your lungs are clear. If you have an incision on your chest or abdomen from surgery, place a pillow or a rolled-up towel firmly against the incision when you cough. This can help to reduce pain while taking deep breaths and coughing. General tips When you are able to get out of bed: Walk around often. Continue to take deep breaths and cough in order to clear your lungs. Keep using the incentive spirometer until your health care provider says it is okay to stop using it. If you have been in the hospital, you may be told to keep using the spirometer at home. Contact a health care provider if: You are having difficulty using the spirometer. You have trouble using the spirometer as often as instructed. Your pain medicine is not giving enough relief for you to use the spirometer as told. You have a fever. Get help right away if: You develop shortness of breath. You develop a cough with bloody mucus from the lungs. You have fluid or blood coming from an  incision site after you cough. Summary An incentive spirometer is a tool that can help you learn to take long, deep breaths to keep your lungs clear and active. You may be asked to use a spirometer after a surgery, if you have a lung problem or a history of smoking, or if you have been inactive for a long period of time. Use your incentive spirometer as instructed every 1-2 hours while you are awake. If you have an incision on your chest or  abdomen, place a pillow or a rolled-up towel firmly against your incision when you cough. This will help to reduce pain. Get help right away if you have shortness of breath, you cough up bloody mucus, or blood comes from your incision when you cough. This information is not intended to replace advice given to you by your health care provider. Make sure you discuss any questions you have with your health care provider. Document Revised: 06/24/2019 Document Reviewed: 06/24/2019 Elsevier Patient Education  2024 [employer].

## 2023-01-05 DIAGNOSIS — O98313 Other infections with a predominantly sexual mode of transmission complicating pregnancy, third trimester: Secondary | ICD-10-CM | POA: Insufficient documentation

## 2023-01-05 DIAGNOSIS — Z7289 Other problems related to lifestyle: Secondary | ICD-10-CM | POA: Insufficient documentation

## 2023-01-05 DIAGNOSIS — O24410 Gestational diabetes mellitus in pregnancy, diet controlled: Secondary | ICD-10-CM | POA: Insufficient documentation

## 2023-01-09 ENCOUNTER — Encounter
Admission: RE | Admit: 2023-01-09 | Discharge: 2023-01-09 | Disposition: A | Discharge status: Home / Self Care | Payer: Medicaid Other | Source: Ambulatory Visit | Attending: Obstetrics and Gynecology | Admitting: Obstetrics and Gynecology

## 2023-01-09 DIAGNOSIS — Z01812 Encounter for preprocedural laboratory examination: Secondary | ICD-10-CM | POA: Insufficient documentation

## 2023-01-09 DIAGNOSIS — Z01818 Encounter for other preprocedural examination: Secondary | ICD-10-CM | POA: Diagnosis present

## 2023-01-09 LAB — BASIC METABOLIC PANEL
Anion gap: 10 (ref 5–15)
BUN: 11 mg/dL (ref 6–20)
CO2: 21 mmol/L — ABNORMAL LOW (ref 22–32)
Calcium: 9.1 mg/dL (ref 8.9–10.3)
Chloride: 104 mmol/L (ref 98–111)
Creatinine, Ser: 0.57 mg/dL (ref 0.44–1.00)
GFR, Estimated: >60 mL/min (ref >60)
Glucose, Bld: 106 mg/dL — ABNORMAL HIGH (ref 70–99)
Potassium: 3.9 mmol/L (ref 3.5–5.1)
Sodium: 135 mmol/L (ref 135–145)

## 2023-01-09 LAB — CBC
HCT: 36.3 % (ref 36.0–46.0)
Hemoglobin: 12.3 g/dL (ref 12.0–15.0)
MCH: 29.5 pg (ref 26.0–34.0)
MCHC: 33.9 g/dL (ref 30.0–36.0)
MCV: 87.1 fL (ref 80.0–100.0)
Platelets: 182 10*3/uL (ref 150–400)
RBC: 4.17 MIL/uL (ref 3.87–5.11)
RDW: 13.2 % (ref 11.5–15.5)
WBC: 10.3 10*3/uL (ref 4.0–10.5)
nRBC: 0 % (ref 0.0–0.2)

## 2023-01-09 MED ORDER — GABAPENTIN 300 MG PO CAPS: 300.0000 mg | ORAL_CAPSULE | Freq: Once | ORAL | Status: DC

## 2023-01-09 MED ORDER — ACETAMINOPHEN 500 MG PO TABS
1000.0000 mg | ORAL_TABLET | Freq: Once | ORAL | Status: AC
  Administered 2023-01-10: 1000 mg via ORAL
  Filled 2023-01-09: qty 2

## 2023-01-09 MED ORDER — CEFAZOLIN SODIUM-DEXTROSE 2-4 GM/100ML-% IV SOLN
2.0000 g | INTRAVENOUS | Status: AC
  Administered 2023-01-10: 2 g via INTRAVENOUS
  Filled 2023-01-09: qty 100

## 2023-01-09 MED ORDER — GABAPENTIN 300 MG PO CAPS
300.0000 mg | ORAL_CAPSULE | Freq: Once | ORAL | Status: AC
  Administered 2023-01-10: 300 mg via ORAL
  Filled 2023-01-09: qty 1

## 2023-01-09 MED ORDER — SOD CITRATE-CITRIC ACID 500-334 MG/5ML PO SOLN: 30.0000 mL | ORAL | Status: DC

## 2023-01-09 MED ORDER — SOD CITRATE-CITRIC ACID 500-334 MG/5ML PO SOLN: 30.0000 mL | ORAL | Status: AC

## 2023-01-09 MED ORDER — LACTATED RINGERS IV SOLN: Freq: Once | INTRAVENOUS | Status: AC

## 2023-01-09 MED ORDER — LACTATED RINGERS IV SOLN: INTRAVENOUS | Status: DC

## 2023-01-09 MED ORDER — CEFAZOLIN SODIUM-DEXTROSE 2-4 GM/100ML-% IV SOLN: 2.0000 g | INTRAVENOUS | Status: DC

## 2023-01-09 MED ORDER — ACETAMINOPHEN 500 MG PO TABS: 1000.0000 mg | ORAL_TABLET | Freq: Once | ORAL | Status: DC

## 2023-01-10 ENCOUNTER — Other Ambulatory Visit: Payer: Self-pay

## 2023-01-10 ENCOUNTER — Encounter: Payer: Self-pay | Admitting: Obstetrics and Gynecology

## 2023-01-10 ENCOUNTER — Encounter
Admission: RE | Disposition: A | Discharge status: Home / Self Care | Payer: Self-pay | Source: Ambulatory Visit | Attending: Obstetrics and Gynecology

## 2023-01-10 ENCOUNTER — Inpatient Hospital Stay
Admission: RE | Admit: 2023-01-10 | Discharge: 2023-01-12 | DRG: 787 | Disposition: A | Discharge status: Home / Self Care | Payer: Medicaid Other | Source: Ambulatory Visit | Attending: Obstetrics and Gynecology | Admitting: Obstetrics and Gynecology

## 2023-01-10 ENCOUNTER — Inpatient Hospital Stay: Payer: Medicaid Other | Admitting: Urgent Care

## 2023-01-10 ENCOUNTER — Inpatient Hospital Stay: Payer: Medicaid Other | Admitting: General Practice

## 2023-01-10 DIAGNOSIS — Z833 Family history of diabetes mellitus: Secondary | ICD-10-CM | POA: Diagnosis not present

## 2023-01-10 DIAGNOSIS — Z87891 Personal history of nicotine dependence: Secondary | ICD-10-CM

## 2023-01-10 DIAGNOSIS — Z820 Family history of epilepsy and other diseases of the nervous system: Secondary | ICD-10-CM

## 2023-01-10 DIAGNOSIS — O34211 Maternal care for low transverse scar from previous cesarean delivery: Secondary | ICD-10-CM | POA: Diagnosis present

## 2023-01-10 DIAGNOSIS — O480 Post-term pregnancy: Secondary | ICD-10-CM | POA: Diagnosis present

## 2023-01-10 DIAGNOSIS — A6000 Herpesviral infection of urogenital system, unspecified: Secondary | ICD-10-CM | POA: Diagnosis present

## 2023-01-10 DIAGNOSIS — O34219 Maternal care for unspecified type scar from previous cesarean delivery: Principal | ICD-10-CM | POA: Diagnosis present

## 2023-01-10 DIAGNOSIS — Z3A40 40 weeks gestation of pregnancy: Secondary | ICD-10-CM | POA: Diagnosis not present

## 2023-01-10 DIAGNOSIS — O9832 Other infections with a predominantly sexual mode of transmission complicating childbirth: Secondary | ICD-10-CM | POA: Diagnosis present

## 2023-01-10 DIAGNOSIS — Z8249 Family history of ischemic heart disease and other diseases of the circulatory system: Secondary | ICD-10-CM

## 2023-01-10 DIAGNOSIS — Z9889 Other specified postprocedural states: Secondary | ICD-10-CM

## 2023-01-10 DIAGNOSIS — Z01818 Encounter for other preprocedural examination: Secondary | ICD-10-CM

## 2023-01-10 LAB — URINE DRUG SCREEN, QUALITATIVE (ARMC ONLY)
Amphetamines, Ur Screen: NOT DETECTED
Barbiturates, Ur Screen: NOT DETECTED
Benzodiazepine, Ur Scrn: NOT DETECTED
Cannabinoid 50 Ng, Ur ~~LOC~~: POSITIVE — AB
Cocaine Metabolite,Ur ~~LOC~~: NOT DETECTED
MDMA (Ecstasy)Ur Screen: NOT DETECTED
Methadone Scn, Ur: NOT DETECTED
Opiate, Ur Screen: NOT DETECTED
Phencyclidine (PCP) Ur S: NOT DETECTED
Tricyclic, Ur Screen: NOT DETECTED

## 2023-01-10 LAB — CBC
HCT: 34.7 % — ABNORMAL LOW (ref 36.0–46.0)
Hemoglobin: 11.7 g/dL — ABNORMAL LOW (ref 12.0–15.0)
MCH: 29.9 pg (ref 26.0–34.0)
MCHC: 33.7 g/dL (ref 30.0–36.0)
MCV: 88.7 fL (ref 80.0–100.0)
Platelets: 205 10*3/uL (ref 150–400)
RBC: 3.91 MIL/uL (ref 3.87–5.11)
RDW: 13.2 % (ref 11.5–15.5)
WBC: 8.3 10*3/uL (ref 4.0–10.5)
nRBC: 0 % (ref 0.0–0.2)

## 2023-01-10 LAB — BASIC METABOLIC PANEL
Anion gap: 10 (ref 5–15)
BUN: 11 mg/dL (ref 6–20)
CO2: 20 mmol/L — ABNORMAL LOW (ref 22–32)
Calcium: 8.7 mg/dL — ABNORMAL LOW (ref 8.9–10.3)
Chloride: 104 mmol/L (ref 98–111)
Creatinine, Ser: 0.62 mg/dL (ref 0.44–1.00)
GFR, Estimated: >60 mL/min (ref >60)
Glucose, Bld: 98 mg/dL (ref 70–99)
Potassium: 4 mmol/L (ref 3.5–5.1)
Sodium: 134 mmol/L — ABNORMAL LOW (ref 135–145)

## 2023-01-10 LAB — RPR
RPR Ser Ql: NONREACTIVE
RPR Ser Ql: NONREACTIVE

## 2023-01-10 LAB — GLUCOSE, CAPILLARY: Glucose-Capillary: 88 mg/dL (ref 70–99)

## 2023-01-10 LAB — RAPID HIV SCREEN (HIV 1/2 AB+AG)
HIV 1/2 Antibodies: NONREACTIVE
HIV-1 P24 Antigen - HIV24: NONREACTIVE

## 2023-01-10 LAB — TYPE AND SCREEN
ABO/RH(D): O POS
Antibody Screen: NEGATIVE

## 2023-01-10 SURGERY — Surgical Case
Anesthesia: Spinal

## 2023-01-10 MED ORDER — DIBUCAINE (PERIANAL) 1 % EX OINT
1.0000 | TOPICAL_OINTMENT | CUTANEOUS | Status: DC | PRN
  Filled 2023-01-10: qty 28

## 2023-01-10 MED ORDER — FENTANYL CITRATE (PF) 100 MCG/2ML IJ SOLN
INTRAMUSCULAR | Status: AC
  Filled 2023-01-10: qty 2

## 2023-01-10 MED ORDER — GLYCOPYRROLATE 0.2 MG/ML IJ SOLN
INTRAMUSCULAR | Status: DC | PRN
  Administered 2023-01-10: .2 mg via INTRAVENOUS

## 2023-01-10 MED ORDER — PRENATAL MULTIVITAMIN CH
1.0000 | ORAL_TABLET | Freq: Every day | ORAL | Status: DC
  Administered 2023-01-10 – 2023-01-12 (×3): 1 via ORAL
  Filled 2023-01-10 (×3): qty 1

## 2023-01-10 MED ORDER — ONDANSETRON HCL 4 MG/2ML IJ SOLN: 4.0000 mg | Freq: Three times a day (TID) | INTRAMUSCULAR | Status: DC | PRN

## 2023-01-10 MED ORDER — IBUPROFEN 600 MG PO TABS
600.0000 mg | ORAL_TABLET | Freq: Four times a day (QID) | ORAL | Status: DC
  Administered 2023-01-11 – 2023-01-12 (×4): 600 mg via ORAL
  Filled 2023-01-10 (×4): qty 1

## 2023-01-10 MED ORDER — SIMETHICONE 80 MG PO CHEW
80.0000 mg | CHEWABLE_TABLET | Freq: Three times a day (TID) | ORAL | Status: DC
  Administered 2023-01-10 – 2023-01-12 (×7): 80 mg via ORAL
  Filled 2023-01-10 (×7): qty 1

## 2023-01-10 MED ORDER — OXYTOCIN-SODIUM CHLORIDE 30-0.9 UT/500ML-% IV SOLN
INTRAVENOUS | Status: DC | PRN
  Administered 2023-01-10: 600 m[IU]/min via INTRAVENOUS

## 2023-01-10 MED ORDER — SENNOSIDES-DOCUSATE SODIUM 8.6-50 MG PO TABS
2.0000 | ORAL_TABLET | Freq: Every day | ORAL | Status: DC
  Administered 2023-01-11 – 2023-01-12 (×2): 2 via ORAL
  Filled 2023-01-10 (×2): qty 2

## 2023-01-10 MED ORDER — OXYTOCIN-SODIUM CHLORIDE 30-0.9 UT/500ML-% IV SOLN: 2.5000 [IU]/h | INTRAVENOUS | Status: DC

## 2023-01-10 MED ORDER — PHENYLEPHRINE HCL-NACL 20-0.9 MG/250ML-% IV SOLN
INTRAVENOUS | Status: DC | PRN
Start: 2023-01-10 — End: 2023-01-10
  Administered 2023-01-10: 50 ug/min via INTRAVENOUS

## 2023-01-10 MED ORDER — MORPHINE SULFATE (PF) 0.5 MG/ML IJ SOLN
INTRAMUSCULAR | Status: DC | PRN
  Administered 2023-01-10: .1 mg via EPIDURAL

## 2023-01-10 MED ORDER — SCOPOLAMINE 1 MG/3DAYS TD PT72: 1.0000 | MEDICATED_PATCH | Freq: Once | TRANSDERMAL | Status: DC

## 2023-01-10 MED ORDER — GLYCOPYRROLATE 0.2 MG/ML IJ SOLN
INTRAMUSCULAR | Status: AC
  Filled 2023-01-10: qty 1

## 2023-01-10 MED ORDER — NICOTINE 21 MG/24HR TD PT24
21.0000 mg | MEDICATED_PATCH | Freq: Every day | TRANSDERMAL | Status: DC
  Administered 2023-01-10 – 2023-01-12 (×3): 21 mg via TRANSDERMAL
  Filled 2023-01-10 (×3): qty 1

## 2023-01-10 MED ORDER — NALOXONE HCL 4 MG/10ML IJ SOLN: 1.0000 ug/kg/h | INTRAVENOUS | Status: DC | PRN

## 2023-01-10 MED ORDER — DIPHENHYDRAMINE HCL 25 MG PO CAPS: 25.0000 mg | ORAL_CAPSULE | Freq: Four times a day (QID) | ORAL | Status: DC | PRN

## 2023-01-10 MED ORDER — SODIUM CHLORIDE 0.9% FLUSH
INTRAVENOUS | Status: DC | PRN
  Administered 2023-01-10: 20 mL via INTRAVENOUS

## 2023-01-10 MED ORDER — DIPHENHYDRAMINE HCL 50 MG/ML IJ SOLN: 12.5000 mg | INTRAMUSCULAR | Status: DC | PRN

## 2023-01-10 MED ORDER — MORPHINE SULFATE (PF) 0.5 MG/ML IJ SOLN
INTRAMUSCULAR | Status: AC
  Filled 2023-01-10: qty 10

## 2023-01-10 MED ORDER — COCONUT OIL OIL
1.0000 | TOPICAL_OIL | Status: DC | PRN
  Filled 2023-01-10: qty 7.5

## 2023-01-10 MED ORDER — GABAPENTIN 300 MG PO CAPS
300.0000 mg | ORAL_CAPSULE | Freq: Every day | ORAL | Status: DC
  Administered 2023-01-10 – 2023-01-11 (×2): 300 mg via ORAL
  Filled 2023-01-10 (×2): qty 1

## 2023-01-10 MED ORDER — NALOXONE HCL 0.4 MG/ML IJ SOLN: 0.4000 mg | INTRAMUSCULAR | Status: DC | PRN

## 2023-01-10 MED ORDER — KETOROLAC TROMETHAMINE 30 MG/ML IJ SOLN: 30.0000 mg | Freq: Four times a day (QID) | INTRAMUSCULAR | Status: DC | PRN

## 2023-01-10 MED ORDER — SEVOFLURANE IN SOLN
RESPIRATORY_TRACT | Status: AC
  Filled 2023-01-10: qty 250

## 2023-01-10 MED ORDER — ENOXAPARIN SODIUM 40 MG/0.4ML IJ SOSY
40.0000 mg | PREFILLED_SYRINGE | INTRAMUSCULAR | Status: DC
  Administered 2023-01-11 – 2023-01-12 (×2): 40 mg via SUBCUTANEOUS
  Filled 2023-01-10 (×3): qty 0.4

## 2023-01-10 MED ORDER — KETOROLAC TROMETHAMINE 30 MG/ML IJ SOLN
INTRAMUSCULAR | Status: DC | PRN
  Administered 2023-01-10: 30 mg via INTRAVENOUS

## 2023-01-10 MED ORDER — OXYCODONE HCL 5 MG PO TABS: 5.0000 mg | ORAL_TABLET | Freq: Four times a day (QID) | ORAL | Status: DC | PRN

## 2023-01-10 MED ORDER — ONDANSETRON HCL 4 MG/2ML IJ SOLN
INTRAMUSCULAR | Status: AC
  Filled 2023-01-10: qty 2

## 2023-01-10 MED ORDER — BUPIVACAINE HCL (PF) 0.25 % IJ SOLN
INTRAMUSCULAR | Status: DC | PRN
  Administered 2023-01-10: 40 mL

## 2023-01-10 MED ORDER — OXYCODONE HCL 5 MG PO TABS: 5.0000 mg | ORAL_TABLET | ORAL | Status: DC | PRN

## 2023-01-10 MED ORDER — SODIUM CHLORIDE 0.9% FLUSH: 3.0000 mL | INTRAVENOUS | Status: DC | PRN

## 2023-01-10 MED ORDER — MENTHOL 3 MG MT LOZG: 1.0000 | LOZENGE | OROMUCOSAL | Status: DC | PRN

## 2023-01-10 MED ORDER — WITCH HAZEL-GLYCERIN EX PADS
1.0000 | MEDICATED_PAD | CUTANEOUS | Status: DC | PRN
  Filled 2023-01-10: qty 100

## 2023-01-10 MED ORDER — FENTANYL CITRATE (PF) 100 MCG/2ML IJ SOLN
INTRAMUSCULAR | Status: DC | PRN
  Administered 2023-01-10: 15 ug via INTRAVENOUS

## 2023-01-10 MED ORDER — ONDANSETRON HCL 4 MG/2ML IJ SOLN
INTRAMUSCULAR | Status: DC | PRN
  Administered 2023-01-10: 4 mg via INTRAVENOUS

## 2023-01-10 MED ORDER — BUPIVACAINE HCL (PF) 0.25 % IJ SOLN
INTRAMUSCULAR | Status: AC
  Filled 2023-01-10: qty 30

## 2023-01-10 MED ORDER — SOD CITRATE-CITRIC ACID 500-334 MG/5ML PO SOLN
ORAL | Status: AC
  Administered 2023-01-10: 30 mL via ORAL
  Filled 2023-01-10: qty 15

## 2023-01-10 MED ORDER — KETOROLAC TROMETHAMINE 30 MG/ML IJ SOLN
INTRAMUSCULAR | Status: AC
  Filled 2023-01-10: qty 1

## 2023-01-10 MED ORDER — BUPIVACAINE IN DEXTROSE 0.75-8.25 % IT SOLN
INTRATHECAL | Status: DC | PRN
  Administered 2023-01-10: 1.5 mL via INTRATHECAL

## 2023-01-10 MED ORDER — ACETAMINOPHEN 500 MG PO TABS
1000.0000 mg | ORAL_TABLET | Freq: Four times a day (QID) | ORAL | Status: DC
  Administered 2023-01-10 – 2023-01-12 (×7): 1000 mg via ORAL
  Filled 2023-01-10 (×8): qty 2

## 2023-01-10 MED ORDER — ZOLPIDEM TARTRATE 5 MG PO TABS: 5.0000 mg | ORAL_TABLET | Freq: Every evening | ORAL | Status: DC | PRN

## 2023-01-10 MED ORDER — KETOROLAC TROMETHAMINE 30 MG/ML IJ SOLN
30.0000 mg | Freq: Four times a day (QID) | INTRAMUSCULAR | Status: AC
  Administered 2023-01-10 – 2023-01-11 (×4): 30 mg via INTRAVENOUS
  Filled 2023-01-10 (×4): qty 1

## 2023-01-10 MED ORDER — MORPHINE SULFATE (PF) 2 MG/ML IV SOLN: 1.0000 mg | INTRAVENOUS | Status: DC | PRN

## 2023-01-10 MED ORDER — DIPHENHYDRAMINE HCL 25 MG PO CAPS: 25.0000 mg | ORAL_CAPSULE | ORAL | Status: DC | PRN

## 2023-01-10 MED ORDER — ACETAMINOPHEN 500 MG PO TABS: 1000.0000 mg | ORAL_TABLET | Freq: Four times a day (QID) | ORAL | Status: DC

## 2023-01-10 MED ORDER — PHENYLEPHRINE HCL-NACL 20-0.9 MG/250ML-% IV SOLN
INTRAVENOUS | Status: AC
  Filled 2023-01-10: qty 250

## 2023-01-10 MED ORDER — MEPERIDINE HCL 25 MG/ML IJ SOLN: 6.2500 mg | INTRAMUSCULAR | Status: DC | PRN

## 2023-01-10 MED ORDER — SIMETHICONE 80 MG PO CHEW: 80.0000 mg | CHEWABLE_TABLET | ORAL | Status: DC | PRN

## 2023-01-10 MED ORDER — OXYTOCIN-SODIUM CHLORIDE 30-0.9 UT/500ML-% IV SOLN
INTRAVENOUS | Status: AC
  Administered 2023-01-10: 2.5 [IU]/h via INTRAVENOUS
  Filled 2023-01-10: qty 500

## 2023-01-10 SURGICAL SUPPLY — 32 items
APL PRP STRL LF DISP 70% ISPRP (MISCELLANEOUS) ×1
BARRIER ADHS 3X4 INTERCEED (GAUZE/BANDAGES/DRESSINGS) ×1 IMPLANT
BRR ADH 4X3 ABS CNTRL BYND (GAUZE/BANDAGES/DRESSINGS) ×1
CHLORAPREP W/TINT 26 (MISCELLANEOUS) ×1 IMPLANT
DRSG TELFA 3X8 NADH STRL (GAUZE/BANDAGES/DRESSINGS) ×1 IMPLANT
ELECT CAUTERY BLADE 6.4 (BLADE) ×1 IMPLANT
ELECT REM PT RETURN 9FT ADLT (ELECTROSURGICAL) ×1
ELECTRODE REM PT RTRN 9FT ADLT (ELECTROSURGICAL) ×1 IMPLANT
GAUZE SPONGE 4X4 12PLY STRL (GAUZE/BANDAGES/DRESSINGS) ×1 IMPLANT
GLOVE SURG SYN 8.0 (GLOVE) ×1
GLOVE SURG SYN 8.0 PF PI (GLOVE) ×1 IMPLANT
GOWN STRL REUS W/ TWL LRG LVL3 (GOWN DISPOSABLE) ×2 IMPLANT
GOWN STRL REUS W/ TWL XL LVL3 (GOWN DISPOSABLE) ×1 IMPLANT
GOWN STRL REUS W/TWL LRG LVL3 (GOWN DISPOSABLE) ×2
GOWN STRL REUS W/TWL XL LVL3 (GOWN DISPOSABLE) ×1
MANIFOLD NEPTUNE II (INSTRUMENTS) ×1 IMPLANT
MAT PREVALON FULL STRYKER (MISCELLANEOUS) ×1 IMPLANT
NDL HYPO 22X1.5 SAFETY MO (MISCELLANEOUS) ×1 IMPLANT
NEEDLE HYPO 22X1.5 SAFETY MO (MISCELLANEOUS) ×1
NS IRRIG 1000ML POUR BTL (IV SOLUTION) ×1 IMPLANT
PACK C SECTION AR (MISCELLANEOUS) ×1 IMPLANT
PAD OB MATERNITY 4.3X12.25 (PERSONAL CARE ITEMS) ×1 IMPLANT
PAD PREP OB/GYN DISP 24X41 (PERSONAL CARE ITEMS) ×1 IMPLANT
SCRUB CHG 4% DYNA-HEX 4OZ (MISCELLANEOUS) ×1 IMPLANT
STRAP SAFETY 5IN WIDE (MISCELLANEOUS) ×1 IMPLANT
SUCT VACUUM KIWI BELL (SUCTIONS) IMPLANT
SUT CHROMIC 1 CTX 36 (SUTURE) ×3 IMPLANT
SUT PLAIN GUT 0 (SUTURE) ×2 IMPLANT
SUT VIC AB 0 CT1 36 (SUTURE) ×2 IMPLANT
SYR 30ML LL (SYRINGE) ×2 IMPLANT
TRAP FLUID SMOKE EVACUATOR (MISCELLANEOUS) ×1 IMPLANT
WATER STERILE IRR 500ML POUR (IV SOLUTION) ×1 IMPLANT

## 2023-01-10 NOTE — Anesthesia Preprocedure Evaluation (Signed)
Anesthesia Evaluation  Patient identified by MRN, date of birth, ID band Patient awake    Reviewed: Allergy & Precautions, NPO status , Patient's Chart, lab work & pertinent test results  Airway Mallampati: III  TM Distance: >3 FB Neck ROM: full    Dental  (+) Chipped, Dental Advidsory Given   Pulmonary neg pulmonary ROS, former smoker   Pulmonary exam normal        Cardiovascular Exercise Tolerance: Good negative cardio ROS Normal cardiovascular exam     Neuro/Psych  PSYCHIATRIC DISORDERS  Depression       GI/Hepatic ,GERD  Medicated and Controlled,,  Endo/Other  diabetes    Renal/GU   negative genitourinary   Musculoskeletal   Abdominal   Peds  Hematology negative hematology ROS (+)   Anesthesia Other Findings Past Medical History: No date: Anemia No date: Asthma     Comment:  well controlled-no inhalers 05/2012: Chlamydia     Comment:  ACHD No date: Current every day vaping     Comment:  during pregnancy as well No date: Depression No date: GERD (gastroesophageal reflux disease) No date: Gestational diabetes No date: H/O tobacco use, presenting hazards to health No date: Herpes genitalia No date: Marijuana use     Comment:  during pregnancy as well 09/2012: Pelvic inflammatory disease     Comment:  [facility] No date: Polycystic ovaries  Past Surgical History: 11/22/2017: CESAREAN SECTION; N/A     Comment:  Procedure: CESAREAN SECTION;  Surgeon: Natale Milch, MD;  Location: [facility] ORS;  Service:               Obstetrics;  Laterality: N/A;  BMI    Body Mass Index: 34.93 kg/m      Reproductive/Obstetrics (+) Pregnancy                             Anesthesia Physical Anesthesia Plan  ASA: 2  Anesthesia Plan: Spinal   Post-op Pain Management:    Induction:   PONV Risk Score and Plan: 2 and Ondansetron, Dexamethasone and TIVA  Airway  Management Planned: Natural Airway and Nasal Cannula  Additional Equipment:   Intra-op Plan:   Post-operative Plan:   Informed Consent: I have reviewed the patients History and Physical, chart, labs and discussed the procedure including the risks, benefits and alternatives for the proposed anesthesia with the patient or authorized representative who has indicated his/her understanding and acceptance.     Dental Advisory Given  Plan Discussed with: Anesthesiologist, CRNA and Surgeon  Anesthesia Plan Comments: (Patient reports no bleeding problems and no anticoagulant use.  Plan for spinal with backup GA  Patient consented for risks of anesthesia including but not limited to:  - adverse reactions to medications - damage to eyes, teeth, lips or other oral mucosa - nerve damage due to positioning  - risk of bleeding, infection and or nerve damage from spinal that could lead to paralysis - risk of headache or failed spinal - damage to teeth, lips or other oral mucosa - sore throat or hoarseness - damage to heart, brain, nerves, lungs, other parts of body or loss of life  Patient voiced understanding.)       Anesthesia Quick Evaluation

## 2023-01-10 NOTE — Op Note (Unsigned)
Brittney Roberts, GEMELLI MEDICAL RECORD NO: [id number] ACCOUNT NO: [id number] DATE OF BIRTH: [birth year] FACILITY: [facility] LOCATION: [facility] PHYSICIAN: Suzy Bouchard, MD  Operative Report   DATE OF PROCEDURE: 01/10/2023  PREOPERATIVE DIAGNOSIS: 1.  A 40+1 weeks estimated gestational age. 2.  Elective repeat cesarean section.  POSTOPERATIVE DIAGNOSIS: 1.  A 40+1 weeks estimated gestational age. 2.  Elective repeat cesarean section. 3 Vigorous female, delivered.  PROCEDURE:  Repeat low transverse cesarean section.  SURGEON:  Suzy Bouchard, MD  FIRST ASSISTANT:  Kathalene Frames, MD  ANESTHESIA:  Spinal.  INDICATIONS:  A 32 year old gravida 2, para 1 at 40+1 weeks estimated gestational age with a prior history of cesarean section.  The patient elects for repeat cesarean section.  DESCRIPTION OF PROCEDURE:  After adequate spinal anesthesia, the patient was placed in dorsal supine position with hip roll on the right side.  The patient's abdomen was prepped and draped in normal sterile fashion.  Timeout was performed.  The patient  did receive 2 grams of IV Ancef prior to commencement of the case.  A Pfannenstiel incision was made 2 fingerbreadths above the symphysis pubis.  Sharp dissection was used to identify the fascia.  Fascia was opened in the midline and opened in transverse  fashion.  The superior aspect of the fascia was grasped with Kocher clamps and the recti muscle was dissected free.  Inferior aspect of the fascia was grasped with Kocher clamps and pyramidalis muscle was dissected free.  Entry into the peritoneal  cavity was accomplished sharply.  Vesicouterine peritoneal fold was identified and opened and reflected inferiorly.  Low transverse uterine incision was made.  Upon entry into the endometrial cavity, clear fluid resulted.  The incision was extended with  blunt transverse traction.  Fetal head was brought to the incision and a Kiwi bell vacuum was  placed on the flexion point of the fetal occiput.  One gentle pull allowed for the head to be delivered and the vacuum was removed.  A loose nuchal cord was  reduced.  Shoulders and body were delivered without difficulty.  Vigorous female was then dried on the mother's abdomen for 60 seconds.  Cord was doubly clamped and vigorous female was passed to nursery staff who assigned Apgar scores of 8 and 9, fetal  weight 3830 grams.  Cord blood was obtained.  Placenta was manually delivered and the uterus was exteriorized.  Endometrial cavity was wiped clean with laparotomy tape.  Uterine incision was closed with 1 chromic suture in a running locking fashion.  One  additional figure-of-eight suture required for good hemostasis.  Posterior cul-de-sac was irrigated and suctioned and the uterus was placed back into the abdominal cavity.  Pericolic gutters were wiped clean with laparotomy tape.  Again, the uterine  incision appeared hemostatic.  The fascia was then closed with 0 Vicryl suture in a running nonlocking fashion, two separate sutures were used.  The fascial edges were injected with a solution of 60 mL of 0.25% Marcaine plus 20 mL normal saline.  25 mL  of the solution was injected.  Subcutaneous tissues were irrigated and bovied and the skin was reapproximated with Insorb absorbable staples with good cosmetic effect.  Four Steri-Strips were placed on the left lateral edge to keep the skin together.   Additional 20 mL of the Marcaine solution was injected beneath the skin.  There were no complications.  QUANTITATIVE BLOOD LOSS:  630 mL.  INTRAOPERATIVE FLUIDS:  1300 mL.  URINE OUTPUT:  50  mL.  The patient tolerated the procedure well and was taken to recovery room in good condition.   PUS D: 01/10/2023 8:50:23 am T: 01/10/2023 9:08:00 am  JOB: [id number]/ [id number]

## 2023-01-10 NOTE — Lactation Note (Signed)
This note was copied from a baby's chart. Lactation Consultation Note  Patient Name: Boy Jolynne Tersigni OZHYQ'M Date: 01/10/2023 Age:31 hour Reason for consult: 1st time breastfeeding;Difficult latch;L&D Initial assessment  Lactation to L&D room for initial visit. Mother is holding the baby in football and attempting latch with Nursery Charity fundraiser. LC began to assist and baby is slipping off the breast, nipple retracts with compression. LC placed baby in laid back hold on the left, he was able to latch and maintain for about 4 minutes without coming off the breast after many attempts at latch. Mother states she attempted to BF her first child but had a manual pump and did not get much milk so she formula fed.   Encouraged feeding on demand and with cues. If baby is not cueing encouraged hand expression and skin to skin. Taught proper technique for hand expression, no drops visible at this visit. Baby was left skin to skin with Mother. Encouraged 8 or more attempts in the first 24 hours and 8 or more good feeds after 24 HOL. Reviewed appropriate diapers for days of life and How to know your baby is getting enough to eat.  Encouraged to call for any assistance. Mother has no further questions at this time.   Maternal Data Has patient been taught Hand Expression?: Yes Does the patient have breastfeeding experience prior to this delivery?: Yes How long did the patient breastfeed?: few days  Feeding Mother's Current Feeding Choice: Breast Milk Nipple Type: Slow - flow  LATCH Score Latch: Repeated attempts needed to sustain latch, nipple held in mouth throughout feeding, stimulation needed to elicit sucking reflex.  Audible Swallowing: None  Type of Nipple: Everted at rest and after stimulation (retracts with compression)  Comfort (Breast/Nipple): Soft / non-tender  Hold (Positioning): Full assist, staff holds infant at breast  LATCH Score: 5   Interventions Interventions: Breast feeding basics  reviewed;Assisted with latch;Position options;Education;Adjust position;Support pillows  Consult Status Consult Status: Follow-up   Oriel Ojo D Prima Rayner 01/10/2023, 11:38 AM

## 2023-01-10 NOTE — Transfer of Care (Signed)
Immediate Anesthesia Transfer of Care Note  Patient: Brittney Roberts  Procedure(s) Performed: REPEAT CESAREAN SECTION  Patient Location: PACU and Mother/Baby  Anesthesia Type:Spinal  Level of Consciousness: awake, alert , and oriented  Airway & Oxygen Therapy: Patient Spontanous Breathing  Post-op Assessment: Report given to RN and Post -op Vital signs reviewed and stable  Post vital signs: Reviewed and stable  Last Vitals:  Vitals Value Taken Time  BP 108/69 01/10/23 0848  Temp    Pulse    Resp 18 01/10/23 0848  SpO2 98 % 01/10/23 0848    Last Pain:  Vitals:   01/10/23 0704  TempSrc: Oral         Complications: No notable events documented.

## 2023-01-10 NOTE — Brief Op Note (Signed)
01/10/2023  8:36 AM  PATIENT:  Brittney Roberts  32 y.o. female  PRE-OPERATIVE DIAGNOSIS:  elective repeat cesarean  POST-OPERATIVE DIAGNOSIS:  same as above   PROCEDURE:  Procedure(s): REPEAT CESAREAN SECTION (N/A)  SURGEON:  Surgeons and Role:    * Careem Yasui, Ihor Austin, MD - Primary    * Jaydee Ingman, Festus Holts, MD - Assisting  PHYSICIAN ASSISTANT:   ASSISTANTS: none   ANESTHESIA:   spinal  EBL:QBL : 630 ccIOF 1300 cc uo 50 cc   BLOOD ADMINISTERED:none  DRAINS: Urinary Catheter (Foley)   LOCAL MEDICATIONS USED:  MARCAINE     SPECIMEN:  No Specimen  DISPOSITION OF SPECIMEN:  N/A  COUNTS:  YES  TOURNIQUET:  * No tourniquets in log *  DICTATION: .Other Dictation: Dictation Number verbal  PLAN OF CARE: Admit to inpatient   PATIENT DISPOSITION:  PACU - hemodynamically stable.   Delay start of Pharmacological VTE agent (>24hrs) due to surgical blood loss or risk of bleeding: not applicable

## 2023-01-10 NOTE — Progress Notes (Signed)
Pt ready for repeat LTCS ( elective) .40+[redacted] wks EGA  NPO . Labs reviewed all questions answered . She still declines BTL today . Proceed

## 2023-01-10 NOTE — Lactation Note (Signed)
This note was copied from a baby's chart. Lactation Consultation Note  Patient Name: Brittney Roberts [id number] Date: 01/10/2023 Age: 31 hours Reason for consult: Follow-up assessment;1st time breastfeeding  LC called to the room to assist with feed. Baby was needing a BG check prior to feed. Baby was unwrapped and placed on the right side in football position. Reviewed hand expression, no visible drops at this time. Baby is not interested in latching and is very sleepy. Taught paced bottle feed, baby tolerated bottle feed well with 10ml. Discussed if baby continues not to latch, encouraged hand expression, pumping and skin to skin with attempts. Mother stated understanding. At the end of the bottle feed, Mother's meal tray arrived. States she will eat and will call LC when she is ready and wanting to pump. LC placed pump equipment at the bedside.  Encouraged to feed on demand and with cues, if baby is not cueing to wake the baby by 3 hours.   Maternal Data Has patient been taught Hand Expression?: Yes  Feeding Mother's Current Feeding Choice: Breast Milk and Formula Nipple Type: Slow - flow  LATCH Score Latch: Too sleepy or reluctant, no latch achieved, no sucking elicited.  Audible Swallowing: None  Type of Nipple: Everted at rest and after stimulation (short, retracts with compression)  Comfort (Breast/Nipple): Soft / non-tender  Hold (Positioning): Assistance needed to correctly position infant at breast and maintain latch.  LATCH Score: 5   Lactation Tools Discussed/Used Tools:  (pump and supplies placed at bedside, MOB will call when she is ready to pump)  Interventions Interventions: Breast feeding basics reviewed;Adjust position;Support pillows;Education  Discharge Pump: Personal (believes she has a Medela at home)  Consult Status Consult Status: Follow-up    Omaree Fuqua D Lyfe Monger 01/10/2023, 4:25 PM

## 2023-01-10 NOTE — Discharge Summary (Signed)
Postpartum Discharge Summary  Patient Name: Brittney Roberts DOB: 1990/07/30 MRN: [id number]  Date of admission: 01/10/2023 Delivery date:01/10/2023 Delivering provider: Suzy Roberts Date of discharge: 01/12/2023  Primary OB: [facility] [id number] last menstrual period was 04/04/2022 (exact date). EDC Estimated Date of Delivery: 01/09/23 Gestational Age at Delivery: [redacted]w[redacted]d   Admitting diagnosis: Previous cesarean delivery affecting pregnancy [O34.219] Post-operative state [Z98.890] Intrauterine pregnancy: [redacted]w[redacted]d     Secondary diagnosis:   Principal Problem:   Previous cesarean delivery affecting pregnancy Active Problems:   Post-operative state   Discharge Diagnosis: Term Pregnancy Delivered      Hospital course: Sceduled C/S   32 y.o. yo G2P2002 at [redacted]w[redacted]d was admitted to the hospital 01/10/2023 for scheduled cesarean section with the following indication:Elective Repeat.Delivery details are as follows:  Membrane Rupture Time/Date: 8:08 AM,01/10/2023  Delivery Method:C-Section, Vacuum Assisted Operative Delivery:N/A Details of operation can be found in separate operative note.  Patient had a postpartum course complicated by none.  She is ambulating, tolerating a regular diet, passing flatus, and urinating well. Patient is discharged home in stable condition on  01/12/23        Newborn Data: Birth date:01/10/2023 Birth time:8:09 AM Gender:Female Living status:Living Apgars:8 ,9  Weight:3830 g                                              Post partum procedures: none Complications: None Delivery Type: repeat cesarean section, low transverse incision Anesthesia: spinal anesthesia Placenta: manual removal To Pathology: No   Prenatal Labs:  ABO, Rh:  O+ Antibody:  neg  Rubella:  Imm, VZ Imm RPR:   NR  HBsAg:   neg , hep C , neg HIV:   Neg  GBS:   Neg   Magnesium Sulfate received: No BMZ received: No Rhophylac:No MMR:No Varivax vaccine given: was not  indicated T-DaP:Given prenatally Flu: No  Transfusion:No  Physical exam  Vitals:   01/11/23 0822 01/11/23 1226 01/11/23 1941 01/11/23 2307  BP: 113/75 121/78 (!) 140/83 99/62  Pulse: 85 80 88 79  Resp: 19 18 18 18   Temp: 98 F (36.7 C) 98.3 F (36.8 C) 97.9 F (36.6 C) 97.8 F (36.6 C)  TempSrc: Oral Oral Oral Oral  SpO2: 99% 100% 100% 97%  Weight:      Height:       General: alert, cooperative, and no distress Lochia: appropriate Uterine Fundus: firm Perineum:minimal edema/intact Incision: Healing well, Dressing is clean, dry, and intact, covered with occlusive OP site dressing  DVT Evaluation: No evidence of DVT seen on physical exam.  Labs: Lab Results  Component Value Date   WBC 9.9 01/11/2023   HGB 10.7 (L) 01/11/2023   HCT 32.0 (L) 01/11/2023   MCV 87.9 01/11/2023   PLT 174 01/11/2023      Latest Ref Rng & Units 01/10/2023    5:55 AM  CMP  Glucose 70 - 99 mg/dL 98   BUN 6 - 20 mg/dL 11   Creatinine 4.40 - 1.00 mg/dL 3.47   Sodium 425 - 956 mmol/L 134   Potassium 3.5 - 5.1 mmol/L 4.0   Chloride 98 - 111 mmol/L 104   CO2 22 - 32 mmol/L 20   Calcium 8.9 - 10.3 mg/dL 8.7    Edinburgh Score:    01/10/2023    7:20 PM  Edinburgh Postnatal Depression Scale Screening Tool  I have been able to laugh and see the funny side of things. 0  I have looked forward with enjoyment to things. 0  I have blamed myself unnecessarily when things went wrong. 1  I have been anxious or worried for no good reason. 0  I have felt scared or panicky for no good reason. 0  Things have been getting on top of me. 0  I have been so unhappy that I have had difficulty sleeping. 0  I have felt sad or miserable. 0  I have been so unhappy that I have been crying. 0  The thought of harming myself has occurred to me. 0  Edinburgh Postnatal Depression Scale Total 1    Risk assessment for postpartum VTE and prophylactic treatment: Very high risk factors: None High risk factors:  None Moderate risk factors: Cesarean delivery , Tobacco use > 1/2 PPD, and BMI 30-40 kg/m2  Postpartum VTE prophylaxis with LMWH not indicated  After visit meds:  Allergies as of 01/12/2023   No Known Allergies      Medication List     STOP taking these medications    valACYclovir 500 MG tablet Commonly known as: VALTREX       TAKE these medications    acetaminophen 500 MG tablet Commonly known as: TYLENOL Take 1 tablet (500 mg total) by mouth every 6 (six) hours as needed.   ibuprofen 600 MG tablet Commonly known as: ADVIL Take 1 tablet (600 mg total) by mouth every 6 (six) hours as needed.   multivitamin-prenatal 27-0.8 MG Tabs tablet Take 1 tablet by mouth daily at 12 noon.   omeprazole 20 MG tablet Commonly known as: PRILOSEC OTC Take 20 mg by mouth as needed.   oxyCODONE 5 MG immediate release tablet Commonly known as: Oxy IR/ROXICODONE Take 1 tablet (5 mg total) by mouth every 6 (six) hours as needed for moderate pain.       Discharge home in stable condition Infant Feeding: Bottle and Breast Infant Disposition:home with mother Discharge instruction: per After Visit Summary and Postpartum booklet. Activity: Advance as tolerated. Pelvic rest for 6 weeks.  Diet: routine diet Anticipated Birth Control:  Condoms and NFP Postpartum Appointment:6 weeks Additional Postpartum F/U: Incision check in 2 weeks Future Appointments:No future appointments. Follow up Visit:  Plan:  [name] was discharged to home in good condition. Follow-up appointment as directed.    SignedCyril Mourning 01/12/2023 9:04 AM

## 2023-01-11 ENCOUNTER — Encounter: Payer: Self-pay | Admitting: Obstetrics and Gynecology

## 2023-01-11 LAB — CBC
HCT: 32 % — ABNORMAL LOW (ref 36.0–46.0)
Hemoglobin: 10.7 g/dL — ABNORMAL LOW (ref 12.0–15.0)
MCH: 29.4 pg (ref 26.0–34.0)
MCHC: 33.4 g/dL (ref 30.0–36.0)
MCV: 87.9 fL (ref 80.0–100.0)
Platelets: 174 10*3/uL (ref 150–400)
RBC: 3.64 MIL/uL — ABNORMAL LOW (ref 3.87–5.11)
RDW: 13.2 % (ref 11.5–15.5)
WBC: 9.9 10*3/uL (ref 4.0–10.5)
nRBC: 0 % (ref 0.0–0.2)

## 2023-01-11 NOTE — Anesthesia Postprocedure Evaluation (Signed)
Anesthesia Post Note  Patient: Brittney Roberts  Procedure(s) Performed: REPEAT CESAREAN SECTION  Patient location during evaluation: Mother Baby Anesthesia Type: Spinal Level of consciousness: oriented and awake and alert Pain management: pain level controlled Vital Signs Assessment: post-procedure vital signs reviewed and stable Respiratory status: spontaneous breathing and respiratory function stable Cardiovascular status: blood pressure returned to baseline and stable Postop Assessment: no headache, no backache, no apparent nausea or vomiting and able to ambulate Anesthetic complications: no  No notable events documented.   Last Vitals:  Vitals:   01/10/23 1921 01/10/23 2300  BP: 122/77 120/78  Pulse: 70 81  Resp: 18 18  Temp: (!) 36.4 C 36.6 C  SpO2: 100% 100%    Last Pain:  Vitals:   01/10/23 2300  TempSrc: Oral  PainSc: 2                  Starling Manns

## 2023-01-11 NOTE — Progress Notes (Signed)
Tou Hayner, CNM and Margaretmary Eddy, CNM to bedside to assess concern of allergic reaction from pressure dressing. Assessment revealed two dime-sized areas of erythema with two small vesicles. No significant drainage observed. Recommended to cover with 2x2 gauze dressing and to leave area open to air when possible.

## 2023-01-11 NOTE — Lactation Note (Signed)
This note was copied from a baby's chart. Lactation Consultation Note  Patient Name: Boy Akayla Braniff [id number] Date: 01/11/2023 Age:31 hours Reason for consult: Follow-up assessment;Term   Maternal Data Lactation to room for a follow up assessment w/ patient and 27hr baby boy "Noah".  Patient verbalized to [facility] that she doesn't have any milk and has been providing formula. Patient has a DEBP kit in the room but has not used it.  Patient stated that she breastfed her first child for 2 weeks and then went to formula.  Patient was also curious if vaping can affect milk supply. Patient mentioned to [facility] that she felt her nipples were bigger this go round and she would think infant can latch better.   Patient inquired about the use of Mother's Milk Tea and lactation cookies to help supply.  Feeding Mother's Current Feeding Choice: Breast Milk and Formula  LC did not observe a feeding at the time.  LC will return for a later feeding to assist patient with latching infant at the breast.   Interventions Interventions: Breast feeding basics reviewed;Education  LC provided education on smoking and vaping and how it can affect milk supply and infant.  LC informed patient the importance of feeding infant on demand at least 8 or more times within a 24hr period.  LC shared that if infant doesn't go to the breast then pumping would be helpful in building and maintaining milk supply.    Consult Status Consult Status: Follow-up Follow-up type: In-patient    Yvette Rack Free 01/11/2023, 11:17 AM

## 2023-01-11 NOTE — Lactation Note (Signed)
This note was copied from a baby's chart. Lactation Consultation Note  Patient Name: Brittney Roberts [id number] Date: 01/11/2023 Age:31 hours Reason for consult: Follow-up assessment;Term   Maternal Data Patient just fed infant 30ml of formula prior to entry into the room.  Patient stated that she is interested in starting to pump.   Feeding Mother's Current Feeding Choice: Breast Milk and Formula  Lactation Tools Discussed/Used Tools: Pump Breast pump type: Double-Electric Breast Pump Pump Education: Setup, frequency, and cleaning;Milk Storage Reason for Pumping: Patient is not putting infant to the breast. Pumping frequency: q3  LC started patient pump.  Education was provided to patient on how to assemble the DEBP, also how to assemble and use the Medela Manual pump, how often she should pump and cleaning guidelines.  LC suggested to patient that she could pump everytime infant receives a bottle of formula. Patient verbalized that she liked that idea.   Patient is pumping with a size 18mm flanges.  No drops of EBM were seen.   Interventions Interventions: Education  Discharge Pump: DEBP  [facility] outpatient lactation services number has been posted on patients board.   Consult Status Consult Status: Follow-up Follow-up type: In-patient    Yvette Rack Free 01/11/2023, 1:28 PM

## 2023-01-11 NOTE — Progress Notes (Signed)
Postpartum Day  1  Subjective: 32 y.o. [id number] postpartum day #1 status post repeat cesarean section. She is ambulating, is tolerating po, is voiding spontaneously.  Her pain is well controlled on PO pain medications. Her lochia is less than menses.  Objective: BP 120/78 (BP Location: Left Arm)   Pulse 81   Temp 97.9 F (36.6 C) (Oral)   Resp 18   Ht 5\' 2"  (1.575 m)   Wt 86.6 kg   LMP 04/04/2022 (Exact Date)   SpO2 100%   Breastfeeding Unknown   BMI 34.93 kg/m    Physical Exam:  General: alert, cooperative, and no distress Breasts: soft/nontender Pulm: nl effort Abdomen: soft, non-tender, active bowel sounds Uterine Fundus: firm Incision: no drainage or bleeding noted on incision dressing  Lochia: appropriate DVT Evaluation: No evidence of DVT seen on physical exam. Negative Homan's sign. No cords or calf tenderness.  Recent Labs    01/10/23 0555 01/11/23 0558  HGB 11.7* 10.7*  HCT 34.7* 32.0*  WBC 8.3 9.9  PLT 205 174    Assessment/Plan: 32 y.o. G2P2002 postpartum day # 1  1. Continue routine postpartum care  2. Infant feeding status: formula feeding and breastfeeding  -Lactation consult PRN for breastfeeding   3. Contraception plan: no method  4. Acute blood loss anemia - clinically not significant .  -Hemodynamically stable and asymptomatic -Intervention: no intervention   5. Immunization status:   all immunizations up to date    Disposition: continue inpatient postpartum care , plan for discharge home tomorrow    LOS: 1 day   Vicy Medico, CNM 01/11/2023, 8:58 AM   ----- Roney Jaffe Certified Nurse Midwife [facility]

## 2023-01-12 MED ORDER — ACETAMINOPHEN 500 MG PO TABS: 500.0000 mg | ORAL_TABLET | Freq: Four times a day (QID) | ORAL | Status: AC | PRN

## 2023-01-12 MED ORDER — OXYCODONE HCL 5 MG PO TABS: 5.0000 mg | ORAL_TABLET | Freq: Four times a day (QID) | ORAL | 0 refills | Status: DC | PRN

## 2023-01-12 MED ORDER — IBUPROFEN 600 MG PO TABS: 600.0000 mg | ORAL_TABLET | Freq: Four times a day (QID) | ORAL | Status: AC | PRN

## 2023-01-12 NOTE — Progress Notes (Signed)
Patient discharged home with infant. Discharge instructions and prescriptions given and reviewed with patient. Patient verbalized understanding. Escorted out by auxillary.

## 2023-01-12 NOTE — Discharge Instructions (Signed)
Discharge Instructions:   Follow-up Appointment:   If there are any new medications, they have been ordered and will be available for pickup at the listed pharmacy on your way home from the hospital.   Call office if you have any of the following: headache, visual changes, fever >101.0 F, chills, shortness of breath, breast concerns, excessive vaginal bleeding, incision drainage or problems, leg pain or redness, depression or any other concerns. If you have vaginal discharge with an odor, let your doctor know.   It is normal to bleed for up to 6 weeks. You should not soak through more than 1 pad in 1 hour. If you have a blood clot larger than your fist with continued bleeding, call your doctor.   After a c-section, you should expect a small amount of blood or clear fluid coming from the incision and abdominal cramping/soreness. Inspect your incision site daily. Stand in front of a mirror to look for any redness, incision opening, or discolored/odorness drainage. Take a shower daily and continue good hygiene. Use own towel and washcloth (do not share). Make sure your sheets on your bed are clean. No pets sleeping around your incision site. Dressing will be removed at your postpartum visit. If the dressing does become wet or soiled underneath, it is okay to remove it.   On-Q pump: You will remove on day 5 after insertion or if the ball becomes flat before day 5. You will remove on: Aug 19, 2018  Activity: Do not lift > 10 lbs for 6 weeks (do not lift anything heavier than your baby). No intercourse, tampons, swimming pools, hot tubs, baths (only showers) for 6 weeks.  No driving for 1-2 weeks. Continue prenatal vitamin, especially if breastfeeding. Increase calories and fluids (water) while breastfeeding.   Your milk will come in, in the next couple of days (right now it is colostrum). You may have a slight fever when your milk comes in, but it should go away on its own.  If it does not, and rises  above 101 F please call the doctor. You will also feel achy and your breasts will be firm. They will also start to leak. If you are breastfeeding, continue as you have been and you can pump/express milk for comfort.   If you have too much milk, your breasts can become engorged, which could lead to mastitis. This is an infection of the milk ducts. It can be very painful and you will need to notify your doctor to obtain a prescription for antibiotics. You can also treat it with a shower or hot/cold compress.   For concerns about your baby, please call your pediatrician.  For breastfeeding concerns, the lactation consultant can be reached at [phone].   Postpartum blues (feelings of happy one minute and sad another minute) are normal for the first few weeks but if it gets worse let your doctor know.   Congratulations! We enjoyed caring for you and your new bundle of joy!

## 2023-01-12 NOTE — Lactation Note (Signed)
This note was copied from a baby's chart. Lactation Consultation Note  Patient Name: Brittney Roberts [id number] Date: 01/12/2023 Age:31 hours Reason for consult: Follow-up assessment;Term;Maternal discharge;Other (Comment) (Discharge Education)   Maternal Data Lactation to room for a follow up assessment/discharge education.  Patient stated that she pump a couple of times overnight but did not see any milk.    Feeding Mother's Current Feeding Choice: Breast Milk and Formula  No feeding or pumping observed during this time.   Interventions Interventions: Education;CDC Guidelines for Breast Pump Cleaning;Guidelines for Milk Supply and Pumping Schedule Handout  Discharge Discharge Education: Engorgement and breast care;Outpatient recommendation  LC provided education to mom on breastmilk storage guidelines (provided CDC handout) and engorgement prevention/treatment.  [facility] outpatient lactation number was provided .  Patient verbalized understanding.   Consult Status Consult Status: Complete Follow-up type: Call as needed    Brittney Roberts 01/12/2023, 9:30 AM

## 2023-01-12 NOTE — Clinical Social Work Maternal (Addendum)
CLINICAL SOCIAL WORK MATERNAL/CHILD NOTE  Patient Details  Name: Brittney Roberts MRN: [id number] Date of Birth: 05/30/1990  Date:  01/12/2023  Clinical Social Worker Initiating Note:  Darolyn Rua Date/Time: Initiated:  01/12/23/1015     Child's Name:  Brittney Roberts  Biological Parents:  Mother, Father   Need for Interpreter:  None   Reason for Referral:   (marijuana use)   Address:  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   Education:      Homebound arranged:    Surveyor, quantity Resources:  Medicaid   Other Resources:  [facility]   Cultural/Religious Considerations Which May Impact Care:    Strengths:  Ability to meet basic needs     Psychotropic Medications:         Pediatrician:       Pediatrician List:   [employer]    [place]    [place] [facility]      Pediatrician Fax Number: [facility]  Risk Factors/Current Problems:  Substance Use     Cognitive State:  Alert     Mood/Affect:  Calm     CSW Assessment:   CSW spoke with patient regarding consult for marijuana use. 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 Patient has a 31 y/o son Brittney Roberts. 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 Reports coping skills past PPD  for first son included working out, hobbies. She reports seeing a therapist and psychiatrist, states she declined depression meds and opted to continue to use coping skills. Aware to follow up with PCP should she discharge and experience depressive symptoms.   Patient reports she [facility] as pediatrician for Brittney Roberts, reports she has hx of cPS involvement with her 4 y/o son Brittney Roberts due to marijuana. Is aware that current cps report will need to be made due to marijuana use and past cps hx. She reports last use was a few weeks ago.   Reports no discharge needs, states having tubs of clothes, diapers, has car seat and crib for baby.   CPS report made.   CSW Plan/Description:  Child Protective Service Report      Darolyn Rua, [place] 01/12/2023, 10:44 AM

## 2023-08-22 ENCOUNTER — Other Ambulatory Visit: Payer: Self-pay

## 2023-08-22 ENCOUNTER — Emergency Department
Admission: EM | Admit: 2023-08-22 | Discharge: 2023-08-22 | Disposition: A | Discharge status: Home / Self Care | Attending: Emergency Medicine | Admitting: Emergency Medicine

## 2023-08-22 ENCOUNTER — Emergency Department

## 2023-08-22 ENCOUNTER — Encounter: Payer: Self-pay | Admitting: Emergency Medicine

## 2023-08-22 DIAGNOSIS — R079 Chest pain, unspecified: Secondary | ICD-10-CM | POA: Insufficient documentation

## 2023-08-22 DIAGNOSIS — R1013 Epigastric pain: Secondary | ICD-10-CM | POA: Diagnosis present

## 2023-08-22 DIAGNOSIS — T383X5A Adverse effect of insulin and oral hypoglycemic [antidiabetic] drugs, initial encounter: Secondary | ICD-10-CM | POA: Insufficient documentation

## 2023-08-22 DIAGNOSIS — T887XXA Unspecified adverse effect of drug or medicament, initial encounter: Secondary | ICD-10-CM

## 2023-08-22 DIAGNOSIS — R112 Nausea with vomiting, unspecified: Secondary | ICD-10-CM | POA: Diagnosis not present

## 2023-08-22 DIAGNOSIS — R0789 Other chest pain: Secondary | ICD-10-CM

## 2023-08-22 LAB — CBC
HCT: 47.6 % — ABNORMAL HIGH (ref 36.0–46.0)
Hemoglobin: 16.6 g/dL — ABNORMAL HIGH (ref 12.0–15.0)
MCH: 30.2 pg (ref 26.0–34.0)
MCHC: 34.9 g/dL (ref 30.0–36.0)
MCV: 86.5 fL (ref 80.0–100.0)
Platelets: 354 10*3/uL (ref 150–400)
RBC: 5.5 MIL/uL — ABNORMAL HIGH (ref 3.87–5.11)
RDW: 11.8 % (ref 11.5–15.5)
WBC: 12 10*3/uL — ABNORMAL HIGH (ref 4.0–10.5)
nRBC: 0 % (ref 0.0–0.2)

## 2023-08-22 LAB — COMPREHENSIVE METABOLIC PANEL WITH GFR
ALT: 56 U/L — ABNORMAL HIGH (ref 0–44)
AST: 39 U/L (ref 15–41)
Albumin: 5 g/dL (ref 3.5–5.0)
Alkaline Phosphatase: 44 U/L (ref 38–126)
Anion gap: 16 — ABNORMAL HIGH (ref 5–15)
BUN: 17 mg/dL (ref 6–20)
CO2: 24 mmol/L (ref 22–32)
Calcium: 9.4 mg/dL (ref 8.9–10.3)
Chloride: 93 mmol/L — ABNORMAL LOW (ref 98–111)
Creatinine, Ser: 0.79 mg/dL (ref 0.44–1.00)
GFR, Estimated: >60 mL/min (ref >60)
Glucose, Bld: 100 mg/dL — ABNORMAL HIGH (ref 70–99)
Potassium: 3.4 mmol/L — ABNORMAL LOW (ref 3.5–5.1)
Sodium: 133 mmol/L — ABNORMAL LOW (ref 135–145)
Total Bilirubin: 1.6 mg/dL — ABNORMAL HIGH (ref 0.0–1.2)
Total Protein: 8.6 g/dL — ABNORMAL HIGH (ref 6.5–8.1)

## 2023-08-22 LAB — LIPASE, BLOOD: Lipase: 25 U/L (ref 11–51)

## 2023-08-22 LAB — HCG, QUANTITATIVE, PREGNANCY: hCG, Beta Chain, Quant, S: <1 m[IU]/mL (ref <5)

## 2023-08-22 MED ORDER — LACTATED RINGERS IV BOLUS
1000.0000 mL | Freq: Once | INTRAVENOUS | Status: AC
  Administered 2023-08-22: 1000 mL via INTRAVENOUS

## 2023-08-22 MED ORDER — ONDANSETRON 4 MG PO TBDP: 4.0000 mg | ORAL_TABLET | Freq: Three times a day (TID) | ORAL | 0 refills | Status: AC | PRN

## 2023-08-22 MED ORDER — LIDOCAINE VISCOUS HCL 2 % MT SOLN
15.0000 mL | Freq: Once | OROMUCOSAL | Status: AC
  Administered 2023-08-22: 15 mL via ORAL
  Filled 2023-08-22: qty 15

## 2023-08-22 MED ORDER — ACETAMINOPHEN 500 MG PO TABS
1000.0000 mg | ORAL_TABLET | Freq: Once | ORAL | Status: AC
  Administered 2023-08-22: 1000 mg via ORAL
  Filled 2023-08-22: qty 2

## 2023-08-22 MED ORDER — ALUM & MAG HYDROXIDE-SIMETH 200-200-20 MG/5ML PO SUSP
30.0000 mL | Freq: Once | ORAL | Status: AC
  Administered 2023-08-22: 30 mL via ORAL
  Filled 2023-08-22: qty 30

## 2023-08-22 MED ORDER — ONDANSETRON 4 MG PO TBDP
4.0000 mg | ORAL_TABLET | Freq: Once | ORAL | Status: AC
  Administered 2023-08-22: 4 mg via ORAL
  Filled 2023-08-22: qty 1

## 2023-08-22 MED ORDER — ONDANSETRON HCL 4 MG/2ML IJ SOLN
4.0000 mg | Freq: Once | INTRAMUSCULAR | Status: AC
  Administered 2023-08-22: 4 mg via INTRAVENOUS
  Filled 2023-08-22: qty 2

## 2023-08-22 NOTE — Discharge Instructions (Signed)
 Please make sure to follow-up with your primary care doctor to discuss switching from [facility] to something else, please also get repeat labs to make sure that your electrolytes are normal as well as to make sure that your liver function testing improves.

## 2023-08-22 NOTE — ED Provider Notes (Signed)
 [facility] Provider Note    Event Date/Time   First MD Initiated Contact with Patient 08/22/23 [phone]     (approximate)   History   Emesis   HPI  Brittney Roberts is a 33 y.o. female with history of GERD, PCOS, depression, presenting with epigastric abdominal pain and nausea vomiting.  States that she started Viola last week, has been having nausea vomiting since late last week, abdominal cramping, states that because of the nausea vomiting, she has burning in her chest.  No diarrhea, states that she is constipated, no fever or cough.  No urinary symptoms.   On interview, she was seen by her OB/GYN in November of last year, had delivered in September of last year, was there for contraception counseling.  Physical Exam   Triage Vital Signs: ED Triage Vitals  Encounter Vitals Group     BP 08/22/23 1334 (!) 142/83     Systolic BP Percentile --      Diastolic BP Percentile --      Pulse Rate 08/22/23 1333 (!) 111     Resp 08/22/23 1333 (!) 22     Temp 08/22/23 1333 98.5 F (36.9 C)     Temp Source 08/22/23 1333 Oral     SpO2 08/22/23 1333 98 %     Weight 08/22/23 1333 190 lb 14.7 oz (86.6 kg)     Height 08/22/23 1333 5\' 2"  (1.575 m)     Head Circumference --      Peak Flow --      Pain Score 08/22/23 1333 8     Pain Loc --      Pain Education --      Exclude from Growth Chart --     Most recent vital signs: Vitals:   08/22/23 1333 08/22/23 1334  BP:  (!) 142/83  Pulse: (!) 111   Resp: (!) 22   Temp: 98.5 F (36.9 C)   SpO2: 98%      General: Awake, no distress.  CV:  Good peripheral perfusion.  Resp:  Normal effort.  No respiratory distress or tachypnea Abd:  No distention.  Soft nontender to deep palpation Other:  No lower extremity edema   ED Results / Procedures / Treatments   Labs (all labs ordered are listed, but only abnormal results are displayed) Labs Reviewed  COMPREHENSIVE METABOLIC PANEL WITH GFR - Abnormal; Notable  for the following components:      Result Value   Sodium 133 (*)    Potassium 3.4 (*)    Chloride 93 (*)    Glucose, Bld 100 (*)    Total Protein 8.6 (*)    ALT 56 (*)    Total Bilirubin 1.6 (*)    Anion gap 16 (*)    All other components within normal limits  CBC - Abnormal; Notable for the following components:   WBC 12.0 (*)    RBC 5.50 (*)    Hemoglobin 16.6 (*)    HCT 47.6 (*)    All other components within normal limits  LIPASE, BLOOD  HCG, QUANTITATIVE, PREGNANCY     EKG  EKG shows, sinus rhythm, rate of 66, normal QRS, normal QTc, T wave flattening in aVL, no ischemic ST elevation, T wave changes new compared to prior   RADIOLOGY On my independent interpretation, no focal consolidation on chest x-ray   PROCEDURES:  Critical Care performed: No  Procedures   MEDICATIONS ORDERED IN ED: Medications  ondansetron  (ZOFRAN -ODT)  disintegrating tablet 4 mg (4 mg Oral Given 08/22/23 1341)  lactated ringers  bolus 1,000 mL (1,000 mLs Intravenous New Bag/Given 08/22/23 1651)  acetaminophen  (TYLENOL ) tablet 1,000 mg (1,000 mg Oral Given 08/22/23 1651)  ondansetron  (ZOFRAN ) injection 4 mg (4 mg Intravenous Given 08/22/23 1651)  alum & mag hydroxide-simeth (MAALOX/MYLANTA) 200-200-20 MG/5ML suspension 30 mL (30 mLs Oral Given 08/22/23 1652)    And  lidocaine  (XYLOCAINE ) 2 % viscous mouth solution 15 mL (15 mLs Oral Given 08/22/23 1652)     IMPRESSION / MDM / ASSESSMENT AND PLAN / ED COURSE  I reviewed the triage vital signs and the nursing notes.                              Differential diagnosis includes, but is not limited to, medication side effects, gastroenteritis, viral illness, GERD, acid reflux, no right upper quadrant abdominal pain to suggest biliary etiology, also consider ACS.  Will get labs, EKG, troponin, chest x-ray, IV fluids, Zofran , Maalox.  Reassess.  Discussed with patient about stopping Wegovy and discussing with the primary care doctors for further medications  that she could use for weight loss.  Patient's presentation is most consistent with acute presentation with potential threat to life or bodily function.  Independent interpretation of labs and imaging below.  Clinical course as below, patient is tolerating p.o., feeling lot better after medications.  Will let her finish the IV fluids.  Discussed with her about stopping Wegovy and switching off to another medication, she can discuss that with her primary care doctor outpatient.  Discussed with her as well as about labs and imaging findings, she will follow-up to get repeat electrolytes as well as LFTs.  Encouraged hydration, will give her outpatient prescription for Zofran .  Otherwise considered but no indication for inpatient admission at this time, she is safe for outpatient management.  Will discharge with strict precautions.    Clinical Course as of 08/22/23 1744  Tue Aug 22, 2023  1726 On independent chart review, mild leukocytosis, electrolytes not severely deranged, creatinine is normal, her ALT and total bilirubin are mildly elevated but she has no right upper quadrant abdominal pain or jaundice, hCG is negative, lipase is normal. [TT]  1736 DG Chest 2 View No active cardiopulmonary disease.  [TT]  1740 On reassessment patient is tolerating p.o., discussed with her about imaging as well as lab results including incidental findings, still has no abdominal pain or jaundice, discussed with her about following up with her primary care doctor to discuss the [facility] as well as to repeat labs to make sure her LFTs are normalizing. [TT]    Clinical Course User Index [TT] Shane Darling, MD     FINAL CLINICAL IMPRESSION(S) / ED DIAGNOSES   Final diagnoses:  Nausea and vomiting, unspecified vomiting type  Epigastric pain  Medication side effect  Burning chest pain     Rx / DC Orders   ED Discharge Orders          Ordered    ondansetron  (ZOFRAN -ODT) 4 MG disintegrating tablet  Every 8  hours PRN        08/22/23 1742             Note:  This document was prepared using Dragon voice recognition software and may include unintentional dictation errors.    Shane Darling, MD 08/22/23 [phone]

## 2023-08-22 NOTE — ED Triage Notes (Signed)
 Pt here with emesis. Pt states she started [facility] last week and has been vomiting constantly since then. Pt states her throat is also burning due to the vomiting along with a migraine and muscle cramps.

## 2023-08-22 NOTE — ED Notes (Signed)
 See triage note  Presents with some n/v  Started on new meds last week  Has have vomiting since Afebrile on arrival
